# Patient Record
Sex: Female | Born: 1935
Health system: Southern US, Community
[De-identification: ages and names within clinical notes are randomized; demographics above are authoritative.]

## PROBLEM LIST (undated history)

## (undated) DIAGNOSIS — F419 Anxiety disorder, unspecified: Secondary | ICD-10-CM

## (undated) DIAGNOSIS — I10 Essential (primary) hypertension: Secondary | ICD-10-CM

---

## 1999-02-23 ENCOUNTER — Other Ambulatory Visit: Admission: RE | Admit: 1999-02-23 | Discharge: 1999-02-23 | Payer: Self-pay | Admitting: Family Medicine

## 1999-06-14 ENCOUNTER — Emergency Department (HOSPITAL_COMMUNITY): Admission: EM | Admit: 1999-06-14 | Discharge: 1999-06-14 | Payer: Self-pay | Admitting: *Deleted

## 1999-07-09 ENCOUNTER — Observation Stay (HOSPITAL_COMMUNITY): Admission: RE | Admit: 1999-07-09 | Discharge: 1999-07-10 | Payer: Self-pay

## 1999-07-09 ENCOUNTER — Encounter (INDEPENDENT_AMBULATORY_CARE_PROVIDER_SITE_OTHER): Payer: Self-pay | Admitting: Specialist

## 1999-10-25 ENCOUNTER — Encounter: Admission: RE | Admit: 1999-10-25 | Discharge: 1999-10-25 | Payer: Self-pay | Admitting: Family Medicine

## 1999-10-25 ENCOUNTER — Encounter: Payer: Self-pay | Admitting: Family Medicine

## 2001-02-23 ENCOUNTER — Other Ambulatory Visit: Admission: RE | Admit: 2001-02-23 | Discharge: 2001-02-23 | Payer: Self-pay | Admitting: Family Medicine

## 2001-03-12 ENCOUNTER — Encounter: Admission: RE | Admit: 2001-03-12 | Discharge: 2001-03-12 | Payer: Self-pay | Admitting: *Deleted

## 2001-03-12 ENCOUNTER — Encounter: Payer: Self-pay | Admitting: *Deleted

## 2001-03-19 ENCOUNTER — Encounter: Payer: Self-pay | Admitting: Family Medicine

## 2001-03-19 ENCOUNTER — Encounter: Admission: RE | Admit: 2001-03-19 | Discharge: 2001-03-19 | Payer: Self-pay | Admitting: Family Medicine

## 2001-04-23 ENCOUNTER — Inpatient Hospital Stay (HOSPITAL_COMMUNITY): Admission: EM | Admit: 2001-04-23 | Discharge: 2001-04-25 | Payer: Self-pay

## 2002-03-27 ENCOUNTER — Encounter: Payer: Self-pay | Admitting: Family Medicine

## 2002-03-27 ENCOUNTER — Encounter: Admission: RE | Admit: 2002-03-27 | Discharge: 2002-03-27 | Payer: Self-pay | Admitting: Family Medicine

## 2003-04-21 ENCOUNTER — Encounter: Admission: RE | Admit: 2003-04-21 | Discharge: 2003-04-21 | Payer: Self-pay | Admitting: Family Medicine

## 2003-04-28 ENCOUNTER — Encounter: Admission: RE | Admit: 2003-04-28 | Discharge: 2003-04-28 | Payer: Self-pay | Admitting: *Deleted

## 2003-08-06 ENCOUNTER — Other Ambulatory Visit: Admission: RE | Admit: 2003-08-06 | Discharge: 2003-08-06 | Payer: Self-pay | Admitting: Family Medicine

## 2004-05-03 ENCOUNTER — Encounter: Admission: RE | Admit: 2004-05-03 | Discharge: 2004-05-03 | Payer: Self-pay | Admitting: *Deleted

## 2004-05-26 ENCOUNTER — Ambulatory Visit (HOSPITAL_COMMUNITY): Admission: RE | Admit: 2004-05-26 | Discharge: 2004-05-26 | Payer: Self-pay | Admitting: Family Medicine

## 2005-06-22 ENCOUNTER — Ambulatory Visit (HOSPITAL_COMMUNITY): Admission: RE | Admit: 2005-06-22 | Discharge: 2005-06-22 | Payer: Self-pay | Admitting: Family Medicine

## 2006-06-15 ENCOUNTER — Encounter: Admission: RE | Admit: 2006-06-15 | Discharge: 2006-06-15 | Payer: Self-pay | Admitting: *Deleted

## 2006-07-20 ENCOUNTER — Ambulatory Visit (HOSPITAL_COMMUNITY): Admission: RE | Admit: 2006-07-20 | Discharge: 2006-07-20 | Payer: Self-pay | Admitting: Family Medicine

## 2007-07-31 ENCOUNTER — Ambulatory Visit (HOSPITAL_COMMUNITY): Admission: RE | Admit: 2007-07-31 | Discharge: 2007-07-31 | Payer: Self-pay | Admitting: Internal Medicine

## 2007-11-15 IMAGING — CT CT ANGIO ABDOMEN
2 of 7 series · 14 of 46 positions shown, 18 images · IV contrast ([ID] OMNI 300)
Comparison: 05/03/2004

CLINICAL DATA: AAA, 2 year followup. Pre stent

CT ANGIOGRAPHY OF ABDOMEN - AAA PROTOCOL
TECHNIQUE: Multidetector CT imaging of the abdomen was performed before and
during bolus injection of intravenous contrast.  Multiplanar CT angiographic
image reconstructions were also generated to evaluate the vascular anatomy.
Contrast:  100 cc Omnipaque 300
TECHNIQUE: Multidetector CT imaging of the pelvis was performed before and
image reconstructions were also generated to evaluate the vascular structures. 
(Contrast dose noted above.)

[Series 5: angio · axial · 0.78mm/px · z∈[-388,-28]mm · 11 of 173 slices shown, 15 images]
[im 19/173  soft-tissue]
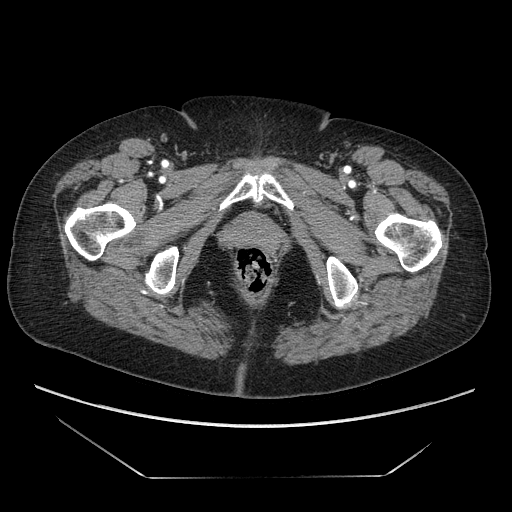
[im 19/173  bone]
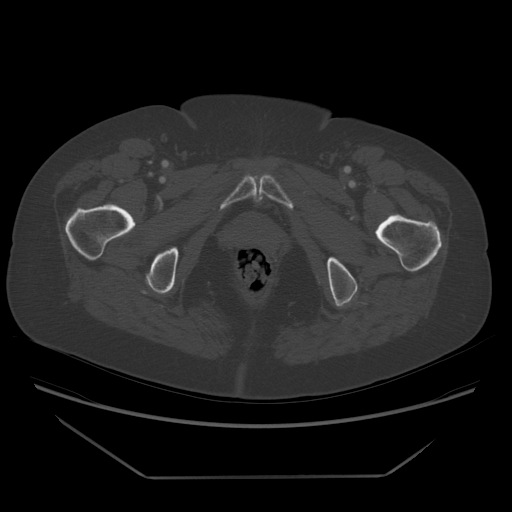
[im 37/173  soft-tissue]
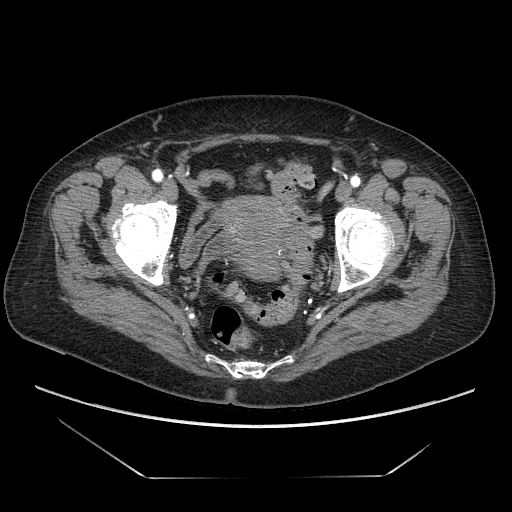
[im 55/173  soft-tissue]
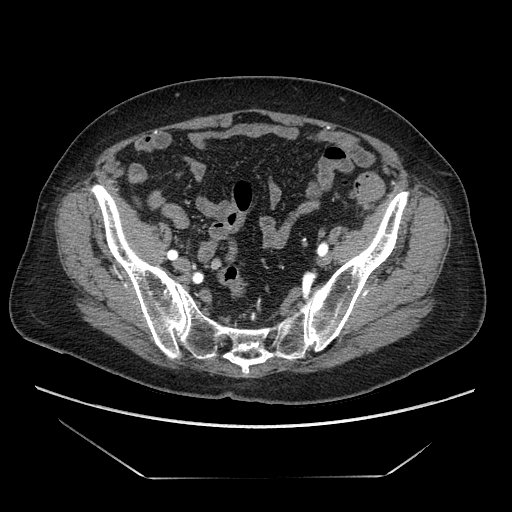
[im 73/173  soft-tissue]
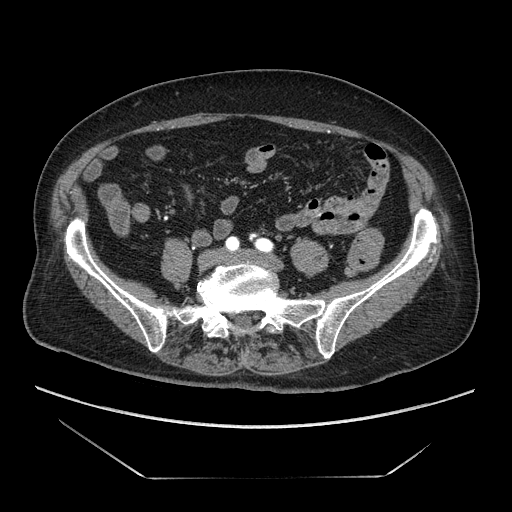
[im 91/173  soft-tissue]
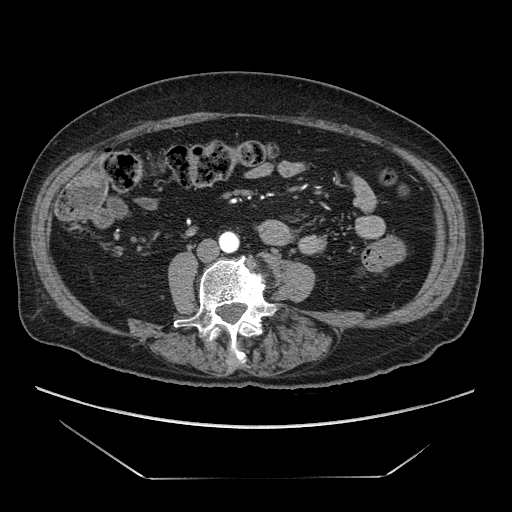
[im 109/173  soft-tissue]
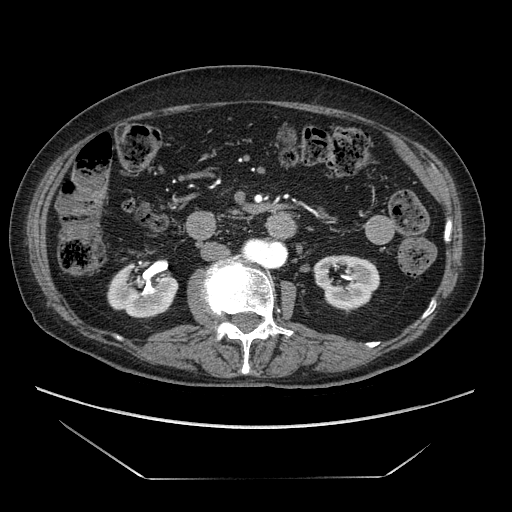
[im 127/173  soft-tissue]
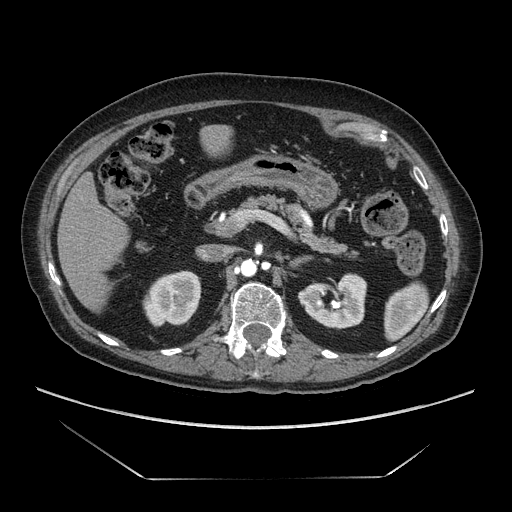
[im 136/173  lung]
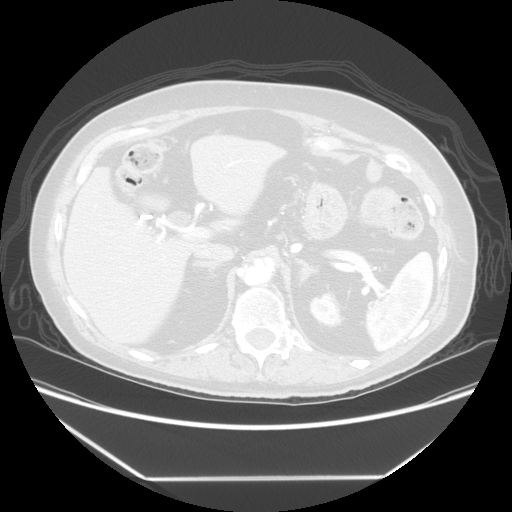
[im 145/173  soft-tissue]
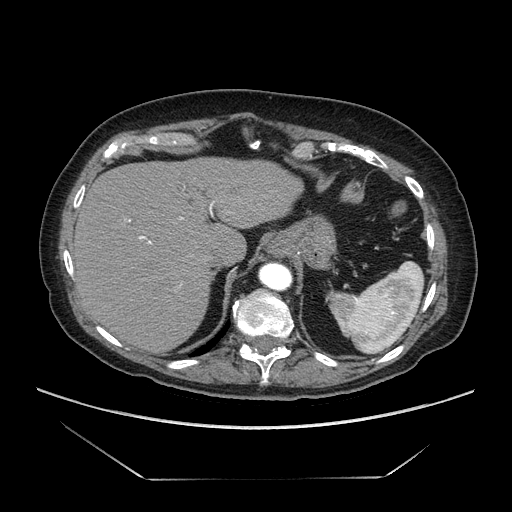
[im 145/173  lung]
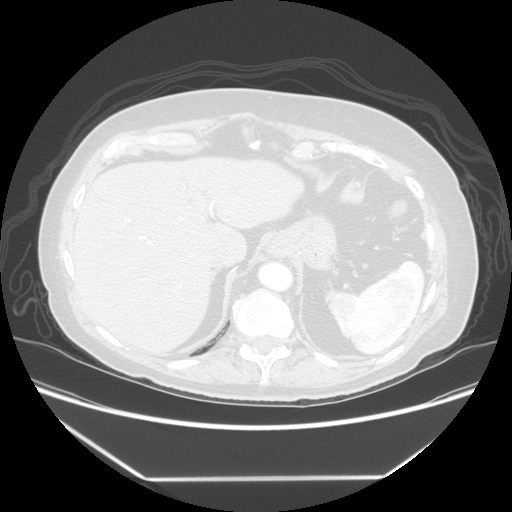
[im 154/173  lung]
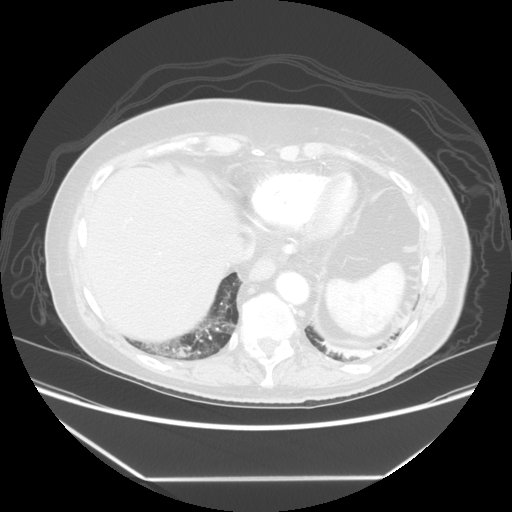
[im 163/173  soft-tissue]
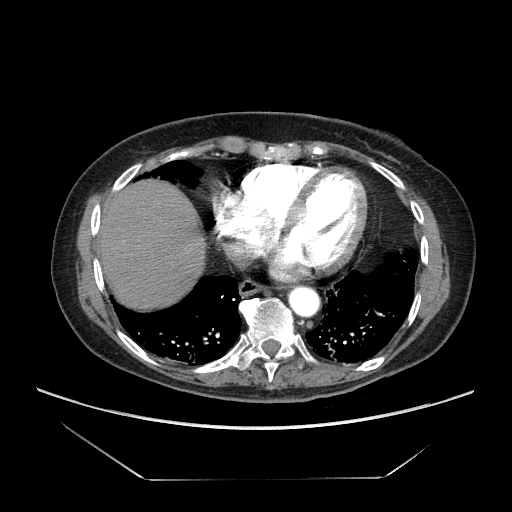
[im 163/173  lung]
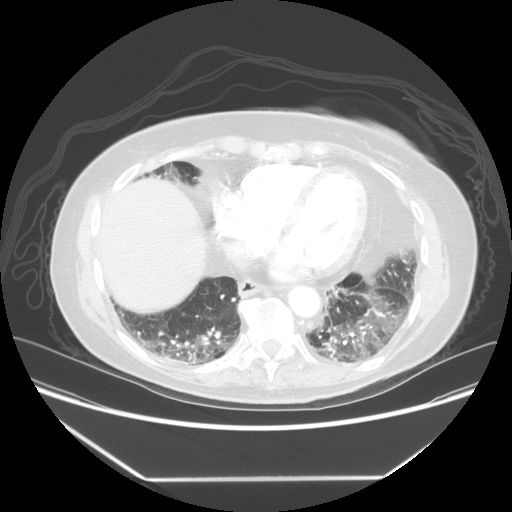
[im 163/173  bone]
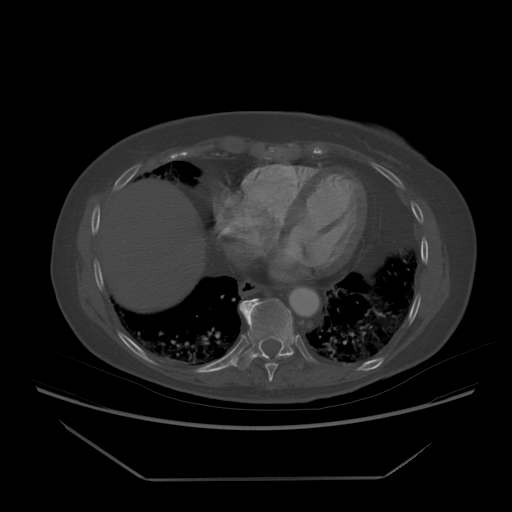

[Series 602: sagittal angio · sagittal · 0.84mm/px · 3 of 156 slices shown]
[im 39/156  soft-tissue]
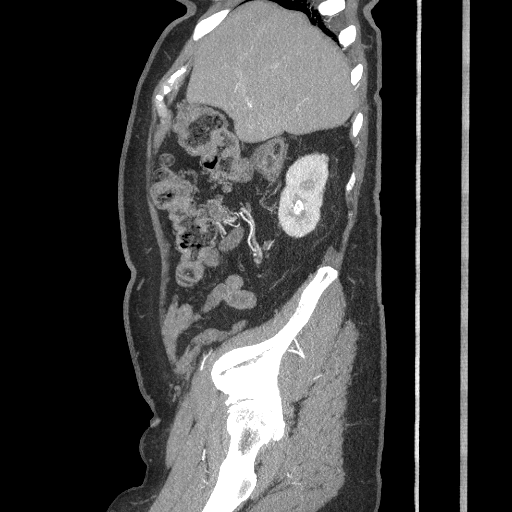
[im 78/156  soft-tissue]
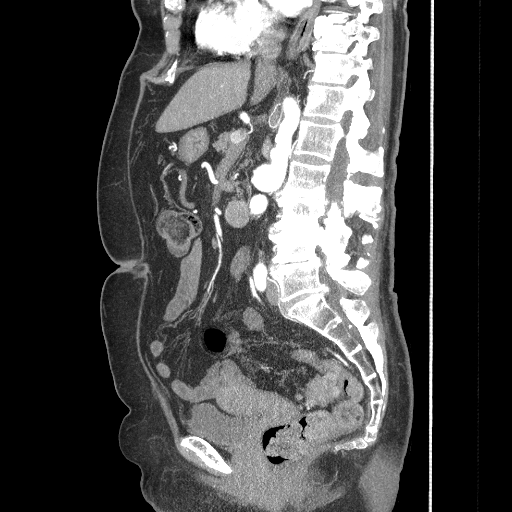
[im 117/156  soft-tissue]
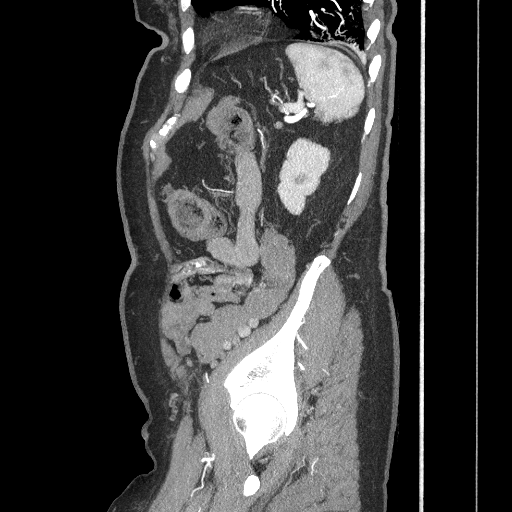

[14 of 46 positions shown; findings below may reference images not displayed]

FINDINGS: Again noted is a markedly tortuous abdominal aorta, with at least 3 
90 degree turns seen in the infrarenal abdominal aorta. Also again noted is
unusual vascular anatomy, with the celiac artery, superior mesenteric artery,
and main bilateral renal arteries arising from the abdominal aorta at the aortic
hiatus. A small accessory left renal artery arises 2 cm below the main left
renal artery.

Tiny low density lesion seen in the dome of the liver posteriorly, unchanged,
likely a simple cyst. Small hiatal hernia present. Solid organs otherwise
unremarkable. No free fluid, free air, or adenopathy. Colonic diverticula
present without active diverticulitis. Scoliosis with degenerative changes in
the lumbar spine noted.

Length of infrarenal neck (from lowest renal artery):  2.3 cm
Diameter of infrarenal neck:  1.3 cm
Number of renal arteries:  Right = 1 ; Left = 2

Total length of aneurysm:  4.5 cm
Greatest aneurysm diameter:  2.8 cm
Aneurysm ends at aortic bifurcation: No 
If no, Distance from aneurysm to bifurcation:  9 cm above 

IMPRESSION

Stable appearance of the abdominal aortic aneurysm, maximum diameter 2.8 cm.
Markedly tortuous abdominal aorta.

CT ANGIOGRAPHY OF PELVIS - AAA PROTOCOL
FINDINGS: Colonic diverticula seen without active diverticulitis. No free
fluid, free air, or adenopathy. Uterus and adnexa are unremarkable.

Greatest common iliac artery diameters:  Right = 1.2 cm ; Left = 1.0 cm
Diameter of common iliac arteries just above iliac bifurcation:  Right = 1.1 cm
; Left = 0.9 cm
Length of common iliac arteries:  Right = 4.6 cm ;  Left = 4.6 cm

IMPRESSION

Non- aneurysmal common iliac arteries as above.

Diverticulosis.

## 2008-08-21 ENCOUNTER — Ambulatory Visit (HOSPITAL_COMMUNITY): Admission: RE | Admit: 2008-08-21 | Discharge: 2008-08-21 | Payer: Self-pay | Admitting: Internal Medicine

## 2009-06-17 ENCOUNTER — Ambulatory Visit: Payer: Self-pay | Admitting: Vascular Surgery

## 2009-07-20 ENCOUNTER — Encounter: Admission: RE | Admit: 2009-07-20 | Discharge: 2009-07-20 | Payer: Self-pay | Admitting: Internal Medicine

## 2009-09-01 ENCOUNTER — Ambulatory Visit (HOSPITAL_COMMUNITY): Admission: RE | Admit: 2009-09-01 | Discharge: 2009-09-01 | Payer: Self-pay | Admitting: Internal Medicine

## 2009-09-04 ENCOUNTER — Encounter: Admission: RE | Admit: 2009-09-04 | Discharge: 2009-09-04 | Payer: Self-pay | Admitting: Internal Medicine

## 2010-07-11 ENCOUNTER — Encounter: Payer: Self-pay | Admitting: Internal Medicine

## 2010-10-14 ENCOUNTER — Other Ambulatory Visit: Payer: Self-pay | Admitting: Internal Medicine

## 2010-10-14 DIAGNOSIS — Z1231 Encounter for screening mammogram for malignant neoplasm of breast: Secondary | ICD-10-CM

## 2010-10-21 ENCOUNTER — Ambulatory Visit
Admission: RE | Admit: 2010-10-21 | Discharge: 2010-10-21 | Disposition: A | Discharge status: Home / Self Care | Payer: Medicare Other | Source: Ambulatory Visit | Attending: Internal Medicine | Admitting: Internal Medicine

## 2010-10-21 DIAGNOSIS — Z1231 Encounter for screening mammogram for malignant neoplasm of breast: Secondary | ICD-10-CM

## 2010-10-22 ENCOUNTER — Other Ambulatory Visit: Payer: Self-pay | Admitting: Internal Medicine

## 2010-10-22 DIAGNOSIS — R928 Other abnormal and inconclusive findings on diagnostic imaging of breast: Secondary | ICD-10-CM

## 2010-10-27 ENCOUNTER — Ambulatory Visit
Admission: RE | Admit: 2010-10-27 | Discharge: 2010-10-27 | Disposition: A | Discharge status: Home / Self Care | Payer: Medicare Other | Source: Ambulatory Visit | Attending: Internal Medicine | Admitting: Internal Medicine

## 2010-10-27 DIAGNOSIS — R928 Other abnormal and inconclusive findings on diagnostic imaging of breast: Secondary | ICD-10-CM

## 2010-11-02 NOTE — Procedures (Signed)
DUPLEX ULTRASOUND OF ABDOMINAL AORTA   INDICATION:  Followup abdominal aortic aneurysm.   HISTORY:  Diabetes:  No.  Cardiac:  No.  Hypertension:  Yes.  Smoking:  No.  Connective Tissue Disorder:  Family History:  No.  Previous Surgery:  No.   DUPLEX EXAM:         AP (cm)                   TRANSVERSE (cm)  Proximal             1.36 cm                   1.82 cm  Mid                  2.84 cm                   3.95 cm  Distal               1.71 cm                   1.68 cm  Right Iliac          1.40 cm                   1.40 cm  Left Iliac           1.01 cm                   1.19 cm   PREVIOUS:  Date:  2.8 cm per CT  AP:  TRANSVERSE:   IMPRESSION:  1. The patient has a tortuous aorta.  2. Abdominal aortic aneurysm with the largest measurement of 2.84 cm x      3.95 cm.   ___________________________________________  Janetta Hora. Fields, MD   CB/MEDQ  D:  06/17/2009  T:  06/17/2009  Job:  [id number]

## 2010-11-05 NOTE — H&P (Signed)
[facility]  Patient:    Dorothy Doyle, Dorothy Doyle Visit Number: [id number] MRN: [id number]          Service Type: OBV Location: 2S 6213 01 Attending Physician:  Lyn Records. Iii Dictated by:   Darci Needle, M.D. Admit Date:  04/23/2001   CC:         Colleen Can. Deborah Chalk, M.D.  Abran Cantor. Clovis Riley, M.D.   History and Physical  REASON FOR ADMISSION:  Left arm pain.  SUBJECTIVE:  The patient is 77 years of age, and awakened from sleep with left arm and posterior shoulder discomfort.  The discomfort was poorly characterized.  The discomfort is best described as an aching sensation. There is also a numb quality.  There were no other associated symptoms. Moving the arm, rubbing the arm, and assuming different positions did not improve the discomfort.  She eventually took one of her husbands nitroglycerin pills, and remarkably within five minutes of the tablet melting, the discomfort had resolved.  There were no other associated symptoms such as shortness of breath, diaphoresis, palpitations, orthopnea, dizziness, and near syncope.  ALLERGIES:  IVP DYE causes rash.  MEDICATIONS:  Lipitor of unknown dose, Lotrel of unknown dose, aspirin one per day.  HABITS:  Does not smoke or drink.  She may have a glass of wine sporadically.  PAST MEDICAL HISTORY: 1. Hypertension. 2. Abdominal aortic aneurysm followed by Dr. Bud Face. 3. Hypercholesterolemia.  FAMILY HISTORY:  Mother had a myocardial infarction at age of 46.  Father died of old age at 34.  He had had a stroke.  She has one brother and three sisters, all are living and in good health.  REVIEW OF SYSTEMS:  She has an occasional strange feeling in the neck.  There is a wave of peculiar discomfort that starts in the base of her neck and goes up towards her jaws bilaterally.  They would last for a few seconds and dissipate, and it is not associated with any other symptoms.  It is not precipitated by  physical activity.  She furthermore denies nausea, palpitations, dyspnea, neurological symptoms, melena, or joint swelling.  She occasionally has low back discomfort.  OBJECTIVE:  VITAL SIGNS:  Blood pressure 128/72, heart rate 68, respirations 16 and unlabored.  She is afebrile.  SKIN:  Clear, no nail bed cyanosis.  HEENT EXAMINATION:  Reveals the pupils are equal and reactive to light.  No xanthelasma.  NECK EXAMINATION:  No JVD, thyromegaly, or carotid bruits.  LUNGS:  Clear to auscultation and percussion.  CARDIAC EXAMINATION:  Reveals no click, no rubs, and no gallops.  ABDOMEN:  Soft, the liver and spleen are not palpable.  Bowel sounds are normal.  No pulsatile masses are noted.  No bruits are heard.  EXTREMITIES:  Revealed no edema.  Pulses are 2+ in the posterior tibial bilaterally.  Radial pulses are 2+.  There is no peripheral edema.  NEUROLOGIC EXAMINATION:  Reveals the patient is alert and oriented x 3.  No motor or sensory deficits are noted.  LABORATORY DATA:  EKG is normal.  Laboratory data includes a normal CPK and troponin initially drawn.  The hemoglobin is 13.  I do not have the results of the patients BMET.  I have not seen her chest x-ray.  ASSESSMENT: 1. Left arm discomfort, uncertain etiology.  Possibly responsive to    nitroglycerin. 2. History of abdominal aortic aneurysm. 3. Hypercholesterolemia. 4. Hypertension.  PLAN: 1. Continue usual antihypertensive therapy. 2. Serial  enzymes, rule out infarction. 3. Further workup per Dr. Roger Shelter. Dictated by:   Darci Needle, M.D. Attending Physician:  Lyn Records. Iii DD:  04/23/01 TD:  04/23/01 Job: [id number]

## 2010-11-05 NOTE — Discharge Summary (Signed)
[facility]  Patient:    Dorothy Doyle, Dorothy Doyle Visit Number: [id number] MRN: [id number]          Service Type: OBV Location: 3W 4782 01 Attending Physician:  Eleanora Neighbor Dictated by:   Jennet Maduro Earl Gala, R.N., A.N.P. Admit Date:  04/23/2001 Discharge Date: 04/25/2001   CC:         Melvyn Neth D. Clovis Riley, M.D.                           Discharge Summary  PRIMARY DISCHARGE DIAGNOSES: Left arm pain responsive to nitroglycerin with subsequent negative cardiac enzymes with elective coronary angiography showing left ventricular function normal, left main is normal, left anterior descending has minor irregularities and left circumflex as well as the right coronary artery is normal. There is tortuosity of the aorta. She will continue to be managed medically.  SECONDARY DISCHARGE DIAGNOSES: 1. Hypertension. 2. Hypercholesterolemia. 3. Known abdominal aortic aneurysm followed by Dr. Liliane Bade.  HISTORY OF PRESENT ILLNESS: The patient is a pleasant 77 year old white female, whose husband is a patient of Dr. Einar Gip. She presented to the emergency room with left arm discomfort that was clearly resolved with nitroglycerins. She was subsequently seen and evaluated and admitted for further treatment.  Please see dictated history and physical per Dr. Garnette Scheuermann for further patient presentation and profile.  ADMISSION LABORATORY DATA: ECG was normal. Cardiac enzymes were normal. Chest x-ray showed a left basilar opacity, likely representing atelectasis and a small left pleural effusion could not be excluded. This was a portable film.  Chemistries on admission were normal. PT and PTT were unremarkable. CBC was normal as well.  Lipid profile showed triglycerides 103, HDL 43, LDL 74 with a total cholesterol of 138.  HOSPITAL COURSE: The patient was admitted. She ruled out negative for myocardial infarction. In light of her response to nitroglycerin as  well as her multiple cardiovascular risk factors we proceeded on with coronary angiography the following day. The procedure was tolerated well without any problems.  Coronary angiography results are as noted above. Her left arm pain was not felt to be cardiac related. She did have postprocedural nausea and vomiting and has had a previous questionable IVP DYE allergy. She was treated with IV fluids.  Her followup labs the following morning were normal and she is felt to be a stable candidate today for disease. Her overall physical examination is unremarkable. The groins remain stable and she is a stable candidate for discharge.  DISCHARGE CONDITION: Stable.  She will resume her home medicines which include Lipitor, Lotrel and aspirin.  ACTIVITY: To be light for the next 1-2 days. She is to use an ice pack as needed to the groin and we will ask her to follow up with Dr. Clovis Riley in the next couple of weeks. She will see Dr. Deborah Chalk back on an as needed basis. Dictated by:   Jennet Maduro Earl Gala, R.N., A.N.P. Attending Physician:  Eleanora Neighbor DD:  04/25/01 TD:  04/26/01 Job: [id number]

## 2010-11-05 NOTE — Cardiovascular Report (Signed)
[facility]  Patient:    Dorothy Doyle, Dorothy Doyle Visit Number: [id number] MRN: [id number]          Service Type: OBV Location: 3W 5784 01 Attending Physician:  Eleanora Neighbor Dictated by:   Colleen Can. Deborah Chalk, M.D. Proc. Date: 04/24/01 Admit Date:  04/23/2001   CC:         Melvyn Neth D. Clovis Riley, M.D.   Cardiac Catheterization  HISTORY: The patient presents with left shoulder pain and is referred for cardiac catheterization. She has a history of abdominal aortic aneurysm.  PROCEDURE: Left heart catheterization with selective coronary angiography, left ventricular angiography.  TYPE AND SITE OF ENTRY: Percutaneous right femoral artery.  CATHETERS: The 6 French 4 curved Judkins right and left coronary catheters, 6 French pigtail ventriculographic catheter.  CONTRAST MATERIAL: Omnipaque.  MEDICATIONS GIVEN PRIOR TO THE PROCEDURE: Valium 10 mg p.o, Versed 25 mg IV.  COMMENTS: The patient tolerated the procedure well.  HEMODYNAMIC DATA: The aortic pressure was 99/53, LV was 103/14.  There was no aortic valve gradient noted on pullback.  ANGIOGRAPHIC DATA: 1. Right coronary artery is a dominant vessel. It has some tortuosity    distally but essentially normal. 2. Left main coronary artery is normal. 3. Left anterior descending has mild irregularities with a 20% segmental    narrowing, otherwise it has minimal nonobstructive coronary disease. 4. The left circumflex is a reasonably large vessel with four obtuse marginal    branches. It is normal.  LEFT VENTRICULAR ANGIOGRAM: Left ventricular angiogram was performed in the RAO position.  Overall cardiac size and silhouette were normal.  Left ventricular function is normal.  Pictures of the abdominal aorta were taken in a panning view after the left ventriculogram and also in the LAO projection using 30 cc of contrast at 20 cc/sec. The aorta is elongated and tortuous. In the RAO view, it  appears that it actually tends to be more of a redundance than true increase in diameter. The distal iliac vessels are relatively free of significant atherosclerosis.  OVERALL IMPRESSION: 1. Normal left ventricular function. 2. Minimal coronary atherosclerosis with mild nonobstructive disease in the    left anterior descending system. 3. Tortuous abdominal aorta with mild increase in the luminal diameter.  DISCUSSION: It is felt that the patients chest pain is probably not cardiac in nature. Dictated by:   Colleen Can Deborah Chalk, M.D. Attending Physician:  Eleanora Neighbor DD:  04/24/01 TD:  04/25/01 Job: [id number]

## 2011-07-22 DIAGNOSIS — H02839 Dermatochalasis of unspecified eye, unspecified eyelid: Secondary | ICD-10-CM | POA: Diagnosis not present

## 2011-07-22 DIAGNOSIS — H538 Other visual disturbances: Secondary | ICD-10-CM | POA: Diagnosis not present

## 2011-07-22 DIAGNOSIS — H2510 Age-related nuclear cataract, unspecified eye: Secondary | ICD-10-CM | POA: Diagnosis not present

## 2011-07-22 DIAGNOSIS — H35369 Drusen (degenerative) of macula, unspecified eye: Secondary | ICD-10-CM | POA: Diagnosis not present

## 2011-08-16 DIAGNOSIS — E785 Hyperlipidemia, unspecified: Secondary | ICD-10-CM | POA: Diagnosis not present

## 2011-08-16 DIAGNOSIS — I714 Abdominal aortic aneurysm, without rupture: Secondary | ICD-10-CM | POA: Diagnosis not present

## 2011-08-16 DIAGNOSIS — I739 Peripheral vascular disease, unspecified: Secondary | ICD-10-CM | POA: Diagnosis not present

## 2011-08-16 DIAGNOSIS — E559 Vitamin D deficiency, unspecified: Secondary | ICD-10-CM | POA: Diagnosis not present

## 2011-08-30 DIAGNOSIS — I714 Abdominal aortic aneurysm, without rupture: Secondary | ICD-10-CM | POA: Diagnosis not present

## 2011-08-30 DIAGNOSIS — I739 Peripheral vascular disease, unspecified: Secondary | ICD-10-CM | POA: Diagnosis not present

## 2011-10-03 DIAGNOSIS — L570 Actinic keratosis: Secondary | ICD-10-CM | POA: Diagnosis not present

## 2011-10-03 DIAGNOSIS — L819 Disorder of pigmentation, unspecified: Secondary | ICD-10-CM | POA: Diagnosis not present

## 2011-10-25 ENCOUNTER — Other Ambulatory Visit: Payer: Self-pay | Admitting: Internal Medicine

## 2011-10-25 DIAGNOSIS — Z1231 Encounter for screening mammogram for malignant neoplasm of breast: Secondary | ICD-10-CM

## 2011-11-10 ENCOUNTER — Ambulatory Visit
Admission: RE | Admit: 2011-11-10 | Discharge: 2011-11-10 | Disposition: A | Discharge status: Home / Self Care | Payer: Medicare Other | Source: Ambulatory Visit | Attending: Internal Medicine | Admitting: Internal Medicine

## 2011-11-10 ENCOUNTER — Other Ambulatory Visit: Payer: Self-pay | Admitting: Internal Medicine

## 2011-11-10 DIAGNOSIS — Z1231 Encounter for screening mammogram for malignant neoplasm of breast: Secondary | ICD-10-CM

## 2011-11-23 ENCOUNTER — Ambulatory Visit
Admission: RE | Admit: 2011-11-23 | Discharge: 2011-11-23 | Disposition: A | Discharge status: Home / Self Care | Payer: Medicare Other | Source: Ambulatory Visit | Attending: Internal Medicine | Admitting: Internal Medicine

## 2011-11-23 DIAGNOSIS — Z1231 Encounter for screening mammogram for malignant neoplasm of breast: Secondary | ICD-10-CM | POA: Diagnosis not present

## 2012-02-14 DIAGNOSIS — E559 Vitamin D deficiency, unspecified: Secondary | ICD-10-CM | POA: Diagnosis not present

## 2012-02-14 DIAGNOSIS — I10 Essential (primary) hypertension: Secondary | ICD-10-CM | POA: Diagnosis not present

## 2012-02-14 DIAGNOSIS — E785 Hyperlipidemia, unspecified: Secondary | ICD-10-CM | POA: Diagnosis not present

## 2012-02-14 DIAGNOSIS — F411 Generalized anxiety disorder: Secondary | ICD-10-CM | POA: Diagnosis not present

## 2012-02-14 DIAGNOSIS — N63 Unspecified lump in unspecified breast: Secondary | ICD-10-CM | POA: Diagnosis not present

## 2012-02-15 ENCOUNTER — Other Ambulatory Visit: Payer: Self-pay | Admitting: Internal Medicine

## 2012-02-15 DIAGNOSIS — N6320 Unspecified lump in the left breast, unspecified quadrant: Secondary | ICD-10-CM

## 2012-02-17 ENCOUNTER — Ambulatory Visit
Admission: RE | Admit: 2012-02-17 | Discharge: 2012-02-17 | Disposition: A | Discharge status: Home / Self Care | Payer: Medicare Other | Source: Ambulatory Visit | Attending: Internal Medicine | Admitting: Internal Medicine

## 2012-02-17 DIAGNOSIS — N6320 Unspecified lump in the left breast, unspecified quadrant: Secondary | ICD-10-CM

## 2012-02-17 DIAGNOSIS — N6009 Solitary cyst of unspecified breast: Secondary | ICD-10-CM | POA: Diagnosis not present

## 2012-04-02 DIAGNOSIS — Z23 Encounter for immunization: Secondary | ICD-10-CM | POA: Diagnosis not present

## 2012-04-02 DIAGNOSIS — L570 Actinic keratosis: Secondary | ICD-10-CM | POA: Diagnosis not present

## 2012-08-16 DIAGNOSIS — M79609 Pain in unspecified limb: Secondary | ICD-10-CM | POA: Diagnosis not present

## 2012-08-29 ENCOUNTER — Encounter (INDEPENDENT_AMBULATORY_CARE_PROVIDER_SITE_OTHER): Payer: Medicare Other | Admitting: *Deleted

## 2012-08-29 DIAGNOSIS — I714 Abdominal aortic aneurysm, without rupture: Secondary | ICD-10-CM | POA: Diagnosis not present

## 2012-08-29 DIAGNOSIS — D509 Iron deficiency anemia, unspecified: Secondary | ICD-10-CM | POA: Diagnosis not present

## 2012-10-02 DIAGNOSIS — D509 Iron deficiency anemia, unspecified: Secondary | ICD-10-CM | POA: Diagnosis not present

## 2012-11-07 DIAGNOSIS — D509 Iron deficiency anemia, unspecified: Secondary | ICD-10-CM | POA: Diagnosis not present

## 2012-11-13 ENCOUNTER — Other Ambulatory Visit: Payer: Self-pay

## 2012-11-13 DIAGNOSIS — Z1231 Encounter for screening mammogram for malignant neoplasm of breast: Secondary | ICD-10-CM

## 2012-11-23 ENCOUNTER — Ambulatory Visit
Admission: RE | Admit: 2012-11-23 | Discharge: 2012-11-23 | Disposition: A | Discharge status: Home / Self Care | Payer: Medicare Other | Source: Ambulatory Visit

## 2012-11-23 DIAGNOSIS — Z1231 Encounter for screening mammogram for malignant neoplasm of breast: Secondary | ICD-10-CM

## 2012-12-12 DIAGNOSIS — D509 Iron deficiency anemia, unspecified: Secondary | ICD-10-CM | POA: Diagnosis not present

## 2013-01-16 DIAGNOSIS — D509 Iron deficiency anemia, unspecified: Secondary | ICD-10-CM | POA: Diagnosis not present

## 2013-02-11 DIAGNOSIS — I714 Abdominal aortic aneurysm, without rupture: Secondary | ICD-10-CM | POA: Diagnosis not present

## 2013-02-11 DIAGNOSIS — M79609 Pain in unspecified limb: Secondary | ICD-10-CM | POA: Diagnosis not present

## 2013-02-11 DIAGNOSIS — M899 Disorder of bone, unspecified: Secondary | ICD-10-CM | POA: Diagnosis not present

## 2013-02-11 DIAGNOSIS — I10 Essential (primary) hypertension: Secondary | ICD-10-CM | POA: Diagnosis not present

## 2013-02-11 DIAGNOSIS — E538 Deficiency of other specified B group vitamins: Secondary | ICD-10-CM | POA: Diagnosis not present

## 2013-02-11 DIAGNOSIS — E785 Hyperlipidemia, unspecified: Secondary | ICD-10-CM | POA: Diagnosis not present

## 2013-02-11 DIAGNOSIS — F411 Generalized anxiety disorder: Secondary | ICD-10-CM | POA: Diagnosis not present

## 2013-02-11 DIAGNOSIS — D509 Iron deficiency anemia, unspecified: Secondary | ICD-10-CM | POA: Diagnosis not present

## 2013-02-11 DIAGNOSIS — E559 Vitamin D deficiency, unspecified: Secondary | ICD-10-CM | POA: Diagnosis not present

## 2013-02-20 DIAGNOSIS — E538 Deficiency of other specified B group vitamins: Secondary | ICD-10-CM | POA: Diagnosis not present

## 2013-02-20 DIAGNOSIS — Z23 Encounter for immunization: Secondary | ICD-10-CM | POA: Diagnosis not present

## 2013-06-28 DIAGNOSIS — H2510 Age-related nuclear cataract, unspecified eye: Secondary | ICD-10-CM | POA: Diagnosis not present

## 2013-06-28 DIAGNOSIS — H25049 Posterior subcapsular polar age-related cataract, unspecified eye: Secondary | ICD-10-CM | POA: Diagnosis not present

## 2013-06-28 DIAGNOSIS — H02839 Dermatochalasis of unspecified eye, unspecified eyelid: Secondary | ICD-10-CM | POA: Diagnosis not present

## 2013-06-28 DIAGNOSIS — H04129 Dry eye syndrome of unspecified lacrimal gland: Secondary | ICD-10-CM | POA: Diagnosis not present

## 2013-07-25 DIAGNOSIS — H2010 Chronic iridocyclitis, unspecified eye: Secondary | ICD-10-CM | POA: Diagnosis not present

## 2013-07-25 DIAGNOSIS — H2510 Age-related nuclear cataract, unspecified eye: Secondary | ICD-10-CM | POA: Diagnosis not present

## 2013-07-25 DIAGNOSIS — H35369 Drusen (degenerative) of macula, unspecified eye: Secondary | ICD-10-CM | POA: Diagnosis not present

## 2013-07-25 DIAGNOSIS — H353190 Nonexudative age-related macular degeneration, unspecified eye, stage unspecified: Secondary | ICD-10-CM | POA: Diagnosis not present

## 2013-07-25 DIAGNOSIS — H25049 Posterior subcapsular polar age-related cataract, unspecified eye: Secondary | ICD-10-CM | POA: Diagnosis not present

## 2013-08-14 DIAGNOSIS — M949 Disorder of cartilage, unspecified: Secondary | ICD-10-CM | POA: Diagnosis not present

## 2013-08-14 DIAGNOSIS — I714 Abdominal aortic aneurysm, without rupture, unspecified: Secondary | ICD-10-CM | POA: Diagnosis not present

## 2013-08-14 DIAGNOSIS — Z1331 Encounter for screening for depression: Secondary | ICD-10-CM | POA: Diagnosis not present

## 2013-08-14 DIAGNOSIS — E538 Deficiency of other specified B group vitamins: Secondary | ICD-10-CM | POA: Diagnosis not present

## 2013-08-14 DIAGNOSIS — I10 Essential (primary) hypertension: Secondary | ICD-10-CM | POA: Diagnosis not present

## 2013-08-14 DIAGNOSIS — E785 Hyperlipidemia, unspecified: Secondary | ICD-10-CM | POA: Diagnosis not present

## 2013-08-14 DIAGNOSIS — E559 Vitamin D deficiency, unspecified: Secondary | ICD-10-CM | POA: Diagnosis not present

## 2013-08-14 DIAGNOSIS — D509 Iron deficiency anemia, unspecified: Secondary | ICD-10-CM | POA: Diagnosis not present

## 2013-08-14 DIAGNOSIS — M899 Disorder of bone, unspecified: Secondary | ICD-10-CM | POA: Diagnosis not present

## 2013-08-14 DIAGNOSIS — F411 Generalized anxiety disorder: Secondary | ICD-10-CM | POA: Diagnosis not present

## 2013-08-26 ENCOUNTER — Ambulatory Visit (HOSPITAL_COMMUNITY)
Admission: RE | Admit: 2013-08-26 | Discharge: 2013-08-26 | Disposition: A | Discharge status: Home / Self Care | Payer: Medicare Other | Source: Ambulatory Visit | Attending: Vascular Surgery | Admitting: Vascular Surgery

## 2013-08-26 ENCOUNTER — Other Ambulatory Visit (HOSPITAL_COMMUNITY): Payer: Self-pay | Admitting: Internal Medicine

## 2013-08-26 DIAGNOSIS — I714 Abdominal aortic aneurysm, without rupture, unspecified: Secondary | ICD-10-CM

## 2013-09-10 DIAGNOSIS — H269 Unspecified cataract: Secondary | ICD-10-CM | POA: Diagnosis not present

## 2013-09-10 DIAGNOSIS — H2510 Age-related nuclear cataract, unspecified eye: Secondary | ICD-10-CM | POA: Diagnosis not present

## 2013-10-15 DIAGNOSIS — H25019 Cortical age-related cataract, unspecified eye: Secondary | ICD-10-CM | POA: Diagnosis not present

## 2013-10-15 DIAGNOSIS — H2510 Age-related nuclear cataract, unspecified eye: Secondary | ICD-10-CM | POA: Diagnosis not present

## 2013-11-26 DIAGNOSIS — H2510 Age-related nuclear cataract, unspecified eye: Secondary | ICD-10-CM | POA: Diagnosis not present

## 2013-11-26 DIAGNOSIS — H269 Unspecified cataract: Secondary | ICD-10-CM | POA: Diagnosis not present

## 2013-12-02 DIAGNOSIS — R42 Dizziness and giddiness: Secondary | ICD-10-CM | POA: Diagnosis not present

## 2013-12-02 DIAGNOSIS — I10 Essential (primary) hypertension: Secondary | ICD-10-CM | POA: Diagnosis not present

## 2014-02-19 DIAGNOSIS — D509 Iron deficiency anemia, unspecified: Secondary | ICD-10-CM | POA: Diagnosis not present

## 2014-02-19 DIAGNOSIS — M899 Disorder of bone, unspecified: Secondary | ICD-10-CM | POA: Diagnosis not present

## 2014-02-19 DIAGNOSIS — R42 Dizziness and giddiness: Secondary | ICD-10-CM | POA: Diagnosis not present

## 2014-02-19 DIAGNOSIS — I10 Essential (primary) hypertension: Secondary | ICD-10-CM | POA: Diagnosis not present

## 2014-02-19 DIAGNOSIS — I714 Abdominal aortic aneurysm, without rupture, unspecified: Secondary | ICD-10-CM | POA: Diagnosis not present

## 2014-02-19 DIAGNOSIS — Z6825 Body mass index (BMI) 25.0-25.9, adult: Secondary | ICD-10-CM | POA: Diagnosis not present

## 2014-02-19 DIAGNOSIS — E785 Hyperlipidemia, unspecified: Secondary | ICD-10-CM | POA: Diagnosis not present

## 2014-02-19 DIAGNOSIS — M949 Disorder of cartilage, unspecified: Secondary | ICD-10-CM | POA: Diagnosis not present

## 2014-02-19 DIAGNOSIS — E538 Deficiency of other specified B group vitamins: Secondary | ICD-10-CM | POA: Diagnosis not present

## 2014-02-28 ENCOUNTER — Ambulatory Visit (HOSPITAL_COMMUNITY)
Admission: RE | Admit: 2014-02-28 | Discharge: 2014-02-28 | Disposition: A | Discharge status: Home / Self Care | Payer: Medicare Other | Source: Ambulatory Visit | Attending: Vascular Surgery | Admitting: Vascular Surgery

## 2014-02-28 ENCOUNTER — Other Ambulatory Visit (HOSPITAL_COMMUNITY): Payer: Self-pay | Admitting: Endocrinology

## 2014-02-28 DIAGNOSIS — I714 Abdominal aortic aneurysm, without rupture, unspecified: Secondary | ICD-10-CM | POA: Insufficient documentation

## 2014-04-19 DIAGNOSIS — Z23 Encounter for immunization: Secondary | ICD-10-CM | POA: Diagnosis not present

## 2014-08-21 DIAGNOSIS — Z6823 Body mass index (BMI) 23.0-23.9, adult: Secondary | ICD-10-CM | POA: Diagnosis not present

## 2014-08-21 DIAGNOSIS — I10 Essential (primary) hypertension: Secondary | ICD-10-CM | POA: Diagnosis not present

## 2014-08-21 DIAGNOSIS — M859 Disorder of bone density and structure, unspecified: Secondary | ICD-10-CM | POA: Diagnosis not present

## 2014-08-21 DIAGNOSIS — D509 Iron deficiency anemia, unspecified: Secondary | ICD-10-CM | POA: Diagnosis not present

## 2014-08-21 DIAGNOSIS — I872 Venous insufficiency (chronic) (peripheral): Secondary | ICD-10-CM | POA: Diagnosis not present

## 2014-08-21 DIAGNOSIS — E785 Hyperlipidemia, unspecified: Secondary | ICD-10-CM | POA: Diagnosis not present

## 2014-08-21 DIAGNOSIS — I480 Paroxysmal atrial fibrillation: Secondary | ICD-10-CM | POA: Diagnosis not present

## 2014-08-21 DIAGNOSIS — R634 Abnormal weight loss: Secondary | ICD-10-CM | POA: Diagnosis not present

## 2014-08-21 DIAGNOSIS — E538 Deficiency of other specified B group vitamins: Secondary | ICD-10-CM | POA: Diagnosis not present

## 2014-08-21 DIAGNOSIS — F419 Anxiety disorder, unspecified: Secondary | ICD-10-CM | POA: Diagnosis not present

## 2014-09-15 DIAGNOSIS — D3131 Benign neoplasm of right choroid: Secondary | ICD-10-CM | POA: Diagnosis not present

## 2014-09-15 DIAGNOSIS — Z961 Presence of intraocular lens: Secondary | ICD-10-CM | POA: Diagnosis not present

## 2014-09-15 DIAGNOSIS — H35033 Hypertensive retinopathy, bilateral: Secondary | ICD-10-CM | POA: Diagnosis not present

## 2014-09-15 DIAGNOSIS — H3531 Nonexudative age-related macular degeneration: Secondary | ICD-10-CM | POA: Diagnosis not present

## 2014-11-12 DIAGNOSIS — I739 Peripheral vascular disease, unspecified: Secondary | ICD-10-CM | POA: Diagnosis not present

## 2014-11-12 DIAGNOSIS — I872 Venous insufficiency (chronic) (peripheral): Secondary | ICD-10-CM | POA: Diagnosis not present

## 2014-11-12 DIAGNOSIS — M79606 Pain in leg, unspecified: Secondary | ICD-10-CM | POA: Diagnosis not present

## 2014-11-12 DIAGNOSIS — Z6823 Body mass index (BMI) 23.0-23.9, adult: Secondary | ICD-10-CM | POA: Diagnosis not present

## 2014-11-13 DIAGNOSIS — I739 Peripheral vascular disease, unspecified: Secondary | ICD-10-CM | POA: Diagnosis not present

## 2014-11-13 DIAGNOSIS — M79606 Pain in leg, unspecified: Secondary | ICD-10-CM | POA: Diagnosis not present

## 2015-01-29 ENCOUNTER — Encounter (HOSPITAL_COMMUNITY): Payer: Self-pay | Admitting: Emergency Medicine

## 2015-01-29 ENCOUNTER — Emergency Department (HOSPITAL_COMMUNITY): Payer: Medicare Other

## 2015-01-29 ENCOUNTER — Emergency Department (HOSPITAL_COMMUNITY)
Admission: EM | Admit: 2015-01-29 | Discharge: 2015-01-29 | Disposition: A | Discharge status: Home / Self Care | Payer: Medicare Other | Attending: Emergency Medicine | Admitting: Emergency Medicine

## 2015-01-29 DIAGNOSIS — Z79899 Other long term (current) drug therapy: Secondary | ICD-10-CM | POA: Insufficient documentation

## 2015-01-29 DIAGNOSIS — Y998 Other external cause status: Secondary | ICD-10-CM | POA: Diagnosis not present

## 2015-01-29 DIAGNOSIS — Y92481 Parking lot as the place of occurrence of the external cause: Secondary | ICD-10-CM | POA: Diagnosis not present

## 2015-01-29 DIAGNOSIS — N3000 Acute cystitis without hematuria: Secondary | ICD-10-CM | POA: Insufficient documentation

## 2015-01-29 DIAGNOSIS — W1839XA Other fall on same level, initial encounter: Secondary | ICD-10-CM | POA: Insufficient documentation

## 2015-01-29 DIAGNOSIS — Z7982 Long term (current) use of aspirin: Secondary | ICD-10-CM | POA: Diagnosis not present

## 2015-01-29 DIAGNOSIS — S80212A Abrasion, left knee, initial encounter: Secondary | ICD-10-CM | POA: Insufficient documentation

## 2015-01-29 DIAGNOSIS — I10 Essential (primary) hypertension: Secondary | ICD-10-CM | POA: Insufficient documentation

## 2015-01-29 DIAGNOSIS — R42 Dizziness and giddiness: Secondary | ICD-10-CM | POA: Diagnosis not present

## 2015-01-29 DIAGNOSIS — W19XXXA Unspecified fall, initial encounter: Secondary | ICD-10-CM

## 2015-01-29 DIAGNOSIS — S0990XA Unspecified injury of head, initial encounter: Secondary | ICD-10-CM | POA: Insufficient documentation

## 2015-01-29 DIAGNOSIS — R404 Transient alteration of awareness: Secondary | ICD-10-CM | POA: Diagnosis not present

## 2015-01-29 DIAGNOSIS — R55 Syncope and collapse: Secondary | ICD-10-CM | POA: Diagnosis not present

## 2015-01-29 DIAGNOSIS — S50312A Abrasion of left elbow, initial encounter: Secondary | ICD-10-CM | POA: Insufficient documentation

## 2015-01-29 DIAGNOSIS — Y9301 Activity, walking, marching and hiking: Secondary | ICD-10-CM | POA: Insufficient documentation

## 2015-01-29 DIAGNOSIS — F419 Anxiety disorder, unspecified: Secondary | ICD-10-CM | POA: Insufficient documentation

## 2015-01-29 DIAGNOSIS — S0081XA Abrasion of other part of head, initial encounter: Secondary | ICD-10-CM | POA: Insufficient documentation

## 2015-01-29 DIAGNOSIS — S80211A Abrasion, right knee, initial encounter: Secondary | ICD-10-CM | POA: Insufficient documentation

## 2015-01-29 DIAGNOSIS — R531 Weakness: Secondary | ICD-10-CM | POA: Diagnosis not present

## 2015-01-29 HISTORY — DX: Essential (primary) hypertension: I10

## 2015-01-29 HISTORY — DX: Anxiety disorder, unspecified: F41.9

## 2015-01-29 LAB — URINALYSIS, ROUTINE W REFLEX MICROSCOPIC
Bilirubin Urine: NEGATIVE
Glucose, UA: NEGATIVE mg/dL
Ketones, ur: NEGATIVE mg/dL
Nitrite: POSITIVE — AB
Protein, ur: NEGATIVE mg/dL
Specific Gravity, Urine: 1.014 (ref 1.005–1.030)
Urobilinogen, UA: 0.2 mg/dL (ref 0.0–1.0)
pH: 8 (ref 5.0–8.0)

## 2015-01-29 LAB — CBC WITH DIFFERENTIAL/PLATELET
Basophils Absolute: 0 K/uL (ref 0.0–0.1)
Basophils Relative: 0 % (ref 0–1)
Eosinophils Absolute: 0.1 K/uL (ref 0.0–0.7)
Eosinophils Relative: 1 % (ref 0–5)
HCT: 42 % (ref 36.0–46.0)
Hemoglobin: 14 g/dL (ref 12.0–15.0)
Lymphocytes Relative: 25 % (ref 12–46)
Lymphs Abs: 1.9 K/uL (ref 0.7–4.0)
MCH: 30.9 pg (ref 26.0–34.0)
MCHC: 33.3 g/dL (ref 30.0–36.0)
MCV: 92.7 fL (ref 78.0–100.0)
Monocytes Absolute: 0.4 K/uL (ref 0.1–1.0)
Monocytes Relative: 5 % (ref 3–12)
Neutro Abs: 5.4 K/uL (ref 1.7–7.7)
Neutrophils Relative %: 69 % (ref 43–77)
Platelets: 225 K/uL (ref 150–400)
RBC: 4.53 MIL/uL (ref 3.87–5.11)
RDW: 12.8 % (ref 11.5–15.5)
WBC: 7.9 K/uL (ref 4.0–10.5)

## 2015-01-29 LAB — I-STAT CHEM 8, ED
BUN: 27 mg/dL — ABNORMAL HIGH (ref 6–20)
CHLORIDE: 104 mmol/L (ref 101–111)
CREATININE: 0.8 mg/dL (ref 0.44–1.00)
Calcium, Ion: 1.22 mmol/L (ref 1.13–1.30)
GLUCOSE: 122 mg/dL — AB (ref 65–99)
HEMATOCRIT: 41 % (ref 36.0–46.0)
Hemoglobin: 13.9 g/dL (ref 12.0–15.0)
POTASSIUM: 3.9 mmol/L (ref 3.5–5.1)
SODIUM: 139 mmol/L (ref 135–145)
TCO2: 24 mmol/L (ref 0–100)

## 2015-01-29 LAB — URINE MICROSCOPIC-ADD ON

## 2015-01-29 LAB — I-STAT TROPONIN, ED: Troponin i, poc: 0.02 ng/mL (ref 0.00–0.08)

## 2015-01-29 MED ORDER — SODIUM CHLORIDE 0.9 % IV BOLUS (SEPSIS)
500.0000 mL | Freq: Once | INTRAVENOUS | Status: AC
  Administered 2015-01-29: 500 mL via INTRAVENOUS

## 2015-01-29 MED ORDER — CEPHALEXIN 250 MG PO CAPS: 250.0000 mg | ORAL_CAPSULE | Freq: Two times a day (BID) | ORAL | Status: AC

## 2015-01-29 NOTE — ED Provider Notes (Signed)
CSN: [id number]     Arrival date & time 01/29/15  1228 History   First MD Initiated Contact with Patient 01/29/15 1241     Chief Complaint  Patient presents with  . Fall     (Consider location/radiation/quality/duration/timing/severity/associated sxs/prior Treatment) Patient is a 81 y.o. female presenting with fall. The history is provided by the patient.  Fall This is a new problem. Pertinent negatives include no abdominal pain.   patient states she had been in the grocery store and then went outside and felt dizzy. States she then fell, and hit her head. Complaining of slight pain in her left elbow also. No chest pain. No trouble breathing. States she feels better than she did when she fell. She is not been ambulatory yet. No loss of consciousness. No localizing numbness or weakness. No fevers or chills. States she's been doing well otherwise.  Past Medical History  Diagnosis Date  . Hypertension   . Anxiety    History reviewed. No pertinent past surgical history. History reviewed. No pertinent family history. Social History  Substance Use Topics  . Smoking status: Never Smoker   . Smokeless tobacco: None  . Alcohol Use: No   OB History    No data available     Review of Systems  Constitutional: Negative for fever and appetite change.  Respiratory: Negative for chest tightness.   Gastrointestinal: Negative for abdominal pain.  Genitourinary: Negative for dyspareunia.  Musculoskeletal: Negative for back pain.  Skin: Positive for wound.  Neurological: Positive for dizziness. Negative for weakness and numbness.  Psychiatric/Behavioral: Negative for confusion.      Allergies  Review of patient's allergies indicates no known allergies.  Home Medications   Prior to Admission medications   Medication Sig Start Date End Date Taking? Authorizing Provider  ALPRAZolam (XANAX) 0.25 MG tablet Take 0.25 mg by mouth daily.   Yes Historical Provider, MD  aspirin 325 MG tablet  Take 325 mg by mouth daily.   Yes Historical Provider, MD  losartan (COZAAR) 25 MG tablet Take 25 mg by mouth daily.   Yes Historical Provider, MD  mirtazapine (REMERON) 7.5 MG tablet Take 7.5 mg by mouth at bedtime.   Yes Historical Provider, MD  cephALEXin (KEFLEX) 250 MG capsule Take 1 capsule (250 mg total) by mouth 2 (two) times daily. 01/29/15   Davonna Belling, MD   BP 189/69 mmHg  Pulse 88  Temp(Src) 98.1 F (36.7 C) (Oral)  Resp 15  Ht 5\' 6"  (1.676 m)  Wt 137 lb (62.143 kg)  BMI 22.12 kg/m2  SpO2 99% Physical Exam  Constitutional: She appears well-developed.  HENT:  Abrasion to upper forehead. No step-off or deformity.  Eyes: EOM are normal.  Cardiovascular: Normal rate and regular rhythm.   Pulmonary/Chest: Effort normal.  Abdominal: Soft. There is no tenderness.  Musculoskeletal: She exhibits no edema or tenderness.  No tenderness over her elbows knees or hips. No midline cervical tenderness.  Neurological: She is alert.  Skin:  Abrasions to bilateral knees and left elbow. No underlying bony tenderness. Range of motion intact.    ED Course  Procedures (including critical care time) Labs Review Labs Reviewed  URINALYSIS, ROUTINE W REFLEX MICROSCOPIC (NOT AT [facility]) - Abnormal; Notable for the following:    APPearance CLOUDY (*)    Hgb urine dipstick SMALL (*)    Nitrite POSITIVE (*)    Leukocytes, UA LARGE (*)    All other components within normal limits  URINE MICROSCOPIC-ADD ON - Abnormal; Notable  for the following:    Bacteria, UA MANY (*)    All other components within normal limits  I-STAT CHEM 8, ED - Abnormal; Notable for the following:    BUN 27 (*)    Glucose, Bld 122 (*)    All other components within normal limits  URINE CULTURE  CBC WITH DIFFERENTIAL/PLATELET  I-STAT TROPOININ, ED    Imaging Review No results found.   EKG Interpretation   Date/Time:  Thursday January 29 2015 12:36:27 EDT Ventricular Rate:  91 PR Interval:  208 QRS  Duration: 86 QT Interval:  363 QTC Calculation: 447 R Axis:   30 Text Interpretation:  Sinus rhythm Borderline prolonged PR interval  Reconfirmed by Alvino Chapel  MD, Ovid Curd [phone]) on 02/02/2015 2:52:52 PM      MDM   Final diagnoses:  Fall, initial encounter  Acute cystitis without hematuria    Patient got lightheaded and fell. No severe injury from the fall. UTI may have contributed to the lightheadedness. Doubt severe syncopal type symptoms. Will discharge home with antibiotics.    Davonna Belling, MD 02/02/15 1455

## 2015-01-29 NOTE — ED Notes (Signed)
Pt walking to car in parking lot. Pt states she got dizzy, fell face forward. Pt complaining of right hip pain/right knee pain. CBG 123, BP 153/93, HR 98 sinus rhythm, 98% on room air. Pt alert and oriented

## 2015-01-29 NOTE — Discharge Instructions (Signed)

## 2015-01-29 NOTE — ED Notes (Signed)
Pt has skin tear to left elbow and abrasion to center forehead.

## 2015-01-31 LAB — URINE CULTURE

## 2015-02-02 ENCOUNTER — Telehealth (HOSPITAL_COMMUNITY): Payer: Self-pay

## 2015-02-02 NOTE — Telephone Encounter (Signed)
Post ED Visit - Positive Culture Follow-up  Culture report reviewed by antimicrobial stewardship pharmacist: []  Wes Gravette, Pharm.D., BCPS []  Heide Guile, Pharm.D., BCPS []  Alycia Rossetti, Pharm.D., BCPS [x]  [place], Pharm.D., BCPS, AAHIVP []  Legrand Como, Pharm.D., BCPS, AAHIVP []  Isac Sarna, Pharm.D., BCPS  Positive Urine culture, >/= 100,000 colonies -> Proteus Mirabilis Treated with Cephalexin, organism sensitive to the same and no further patient follow-up is required at this time.  Dortha Kern 02/02/2015, 4:54 AM

## 2015-02-17 DIAGNOSIS — Z6823 Body mass index (BMI) 23.0-23.9, adult: Secondary | ICD-10-CM | POA: Diagnosis not present

## 2015-02-17 DIAGNOSIS — D509 Iron deficiency anemia, unspecified: Secondary | ICD-10-CM | POA: Diagnosis not present

## 2015-02-17 DIAGNOSIS — E785 Hyperlipidemia, unspecified: Secondary | ICD-10-CM | POA: Diagnosis not present

## 2015-02-17 DIAGNOSIS — F419 Anxiety disorder, unspecified: Secondary | ICD-10-CM | POA: Diagnosis not present

## 2015-02-17 DIAGNOSIS — I480 Paroxysmal atrial fibrillation: Secondary | ICD-10-CM | POA: Diagnosis not present

## 2015-02-17 DIAGNOSIS — I10 Essential (primary) hypertension: Secondary | ICD-10-CM | POA: Diagnosis not present

## 2015-02-17 DIAGNOSIS — E559 Vitamin D deficiency, unspecified: Secondary | ICD-10-CM | POA: Diagnosis not present

## 2015-02-17 DIAGNOSIS — I714 Abdominal aortic aneurysm, without rupture: Secondary | ICD-10-CM | POA: Diagnosis not present

## 2015-02-17 DIAGNOSIS — Z23 Encounter for immunization: Secondary | ICD-10-CM | POA: Diagnosis not present

## 2015-02-17 DIAGNOSIS — I872 Venous insufficiency (chronic) (peripheral): Secondary | ICD-10-CM | POA: Diagnosis not present

## 2015-02-17 DIAGNOSIS — M859 Disorder of bone density and structure, unspecified: Secondary | ICD-10-CM | POA: Diagnosis not present

## 2015-02-17 DIAGNOSIS — E538 Deficiency of other specified B group vitamins: Secondary | ICD-10-CM | POA: Diagnosis not present

## 2015-07-16 DIAGNOSIS — Z0289 Encounter for other administrative examinations: Secondary | ICD-10-CM | POA: Diagnosis not present

## 2015-07-30 DIAGNOSIS — F4489 Other dissociative and conversion disorders: Secondary | ICD-10-CM | POA: Diagnosis not present

## 2015-07-30 DIAGNOSIS — Z6823 Body mass index (BMI) 23.0-23.9, adult: Secondary | ICD-10-CM | POA: Diagnosis not present

## 2015-07-30 DIAGNOSIS — N390 Urinary tract infection, site not specified: Secondary | ICD-10-CM | POA: Diagnosis not present

## 2015-07-30 DIAGNOSIS — R300 Dysuria: Secondary | ICD-10-CM | POA: Diagnosis not present

## 2015-07-30 DIAGNOSIS — I10 Essential (primary) hypertension: Secondary | ICD-10-CM | POA: Diagnosis not present

## 2015-07-30 DIAGNOSIS — N8111 Cystocele, midline: Secondary | ICD-10-CM | POA: Diagnosis not present

## 2015-08-17 DIAGNOSIS — I872 Venous insufficiency (chronic) (peripheral): Secondary | ICD-10-CM | POA: Diagnosis not present

## 2015-08-17 DIAGNOSIS — F419 Anxiety disorder, unspecified: Secondary | ICD-10-CM | POA: Diagnosis not present

## 2015-08-17 DIAGNOSIS — N8111 Cystocele, midline: Secondary | ICD-10-CM | POA: Diagnosis not present

## 2015-08-17 DIAGNOSIS — I10 Essential (primary) hypertension: Secondary | ICD-10-CM | POA: Diagnosis not present

## 2015-08-17 DIAGNOSIS — F4489 Other dissociative and conversion disorders: Secondary | ICD-10-CM | POA: Diagnosis not present

## 2015-08-17 DIAGNOSIS — Z1389 Encounter for screening for other disorder: Secondary | ICD-10-CM | POA: Diagnosis not present

## 2015-08-17 DIAGNOSIS — Z6822 Body mass index (BMI) 22.0-22.9, adult: Secondary | ICD-10-CM | POA: Diagnosis not present

## 2015-08-17 DIAGNOSIS — M859 Disorder of bone density and structure, unspecified: Secondary | ICD-10-CM | POA: Diagnosis not present

## 2015-08-17 DIAGNOSIS — E7800 Pure hypercholesterolemia, unspecified: Secondary | ICD-10-CM | POA: Diagnosis not present

## 2015-08-17 DIAGNOSIS — I714 Abdominal aortic aneurysm, without rupture: Secondary | ICD-10-CM | POA: Diagnosis not present

## 2015-08-17 DIAGNOSIS — I480 Paroxysmal atrial fibrillation: Secondary | ICD-10-CM | POA: Diagnosis not present

## 2015-08-17 DIAGNOSIS — E559 Vitamin D deficiency, unspecified: Secondary | ICD-10-CM | POA: Diagnosis not present

## 2015-08-21 ENCOUNTER — Other Ambulatory Visit: Payer: Self-pay | Admitting: Internal Medicine

## 2015-08-21 DIAGNOSIS — I714 Abdominal aortic aneurysm, without rupture, unspecified: Secondary | ICD-10-CM

## 2015-09-02 DIAGNOSIS — M859 Disorder of bone density and structure, unspecified: Secondary | ICD-10-CM | POA: Diagnosis not present

## 2015-09-10 ENCOUNTER — Other Ambulatory Visit (HOSPITAL_COMMUNITY): Payer: Self-pay | Admitting: *Deleted

## 2015-09-11 ENCOUNTER — Ambulatory Visit (HOSPITAL_COMMUNITY)
Admission: RE | Admit: 2015-09-11 | Discharge: 2015-09-11 | Disposition: A | Discharge status: Home / Self Care | Payer: Medicare Other | Source: Ambulatory Visit | Attending: Internal Medicine | Admitting: Internal Medicine

## 2015-09-11 DIAGNOSIS — M810 Age-related osteoporosis without current pathological fracture: Secondary | ICD-10-CM | POA: Insufficient documentation

## 2015-09-11 MED ORDER — ZOLEDRONIC ACID 5 MG/100ML IV SOLN
5.0000 mg | Freq: Once | INTRAVENOUS | Status: AC
  Administered 2015-09-11: 5 mg via INTRAVENOUS

## 2015-09-11 MED ORDER — ZOLEDRONIC ACID 5 MG/100ML IV SOLN
INTRAVENOUS | Status: AC
  Filled 2015-09-11: qty 100

## 2015-09-15 ENCOUNTER — Ambulatory Visit: Payer: Medicare Other | Admitting: Diagnostic Neuroimaging

## 2015-10-30 DIAGNOSIS — Z0000 Encounter for general adult medical examination without abnormal findings: Secondary | ICD-10-CM | POA: Diagnosis not present

## 2015-10-30 DIAGNOSIS — R350 Frequency of micturition: Secondary | ICD-10-CM | POA: Diagnosis not present

## 2015-10-30 DIAGNOSIS — N3020 Other chronic cystitis without hematuria: Secondary | ICD-10-CM | POA: Diagnosis not present

## 2015-10-30 DIAGNOSIS — N3941 Urge incontinence: Secondary | ICD-10-CM | POA: Diagnosis not present

## 2015-12-11 DIAGNOSIS — F419 Anxiety disorder, unspecified: Secondary | ICD-10-CM | POA: Diagnosis not present

## 2015-12-11 DIAGNOSIS — I10 Essential (primary) hypertension: Secondary | ICD-10-CM | POA: Diagnosis not present

## 2015-12-11 DIAGNOSIS — Z6822 Body mass index (BMI) 22.0-22.9, adult: Secondary | ICD-10-CM | POA: Diagnosis not present

## 2015-12-11 DIAGNOSIS — M859 Disorder of bone density and structure, unspecified: Secondary | ICD-10-CM | POA: Diagnosis not present

## 2015-12-11 DIAGNOSIS — N8111 Cystocele, midline: Secondary | ICD-10-CM | POA: Diagnosis not present

## 2015-12-11 DIAGNOSIS — I480 Paroxysmal atrial fibrillation: Secondary | ICD-10-CM | POA: Diagnosis not present

## 2015-12-11 DIAGNOSIS — E559 Vitamin D deficiency, unspecified: Secondary | ICD-10-CM | POA: Diagnosis not present

## 2015-12-11 DIAGNOSIS — I872 Venous insufficiency (chronic) (peripheral): Secondary | ICD-10-CM | POA: Diagnosis not present

## 2015-12-11 DIAGNOSIS — F4489 Other dissociative and conversion disorders: Secondary | ICD-10-CM | POA: Diagnosis not present

## 2015-12-11 DIAGNOSIS — N3941 Urge incontinence: Secondary | ICD-10-CM | POA: Diagnosis not present

## 2015-12-11 DIAGNOSIS — E7800 Pure hypercholesterolemia, unspecified: Secondary | ICD-10-CM | POA: Diagnosis not present

## 2015-12-23 DIAGNOSIS — N1330 Unspecified hydronephrosis: Secondary | ICD-10-CM | POA: Diagnosis not present

## 2016-01-22 DIAGNOSIS — N281 Cyst of kidney, acquired: Secondary | ICD-10-CM | POA: Diagnosis not present

## 2016-01-22 DIAGNOSIS — K4090 Unilateral inguinal hernia, without obstruction or gangrene, not specified as recurrent: Secondary | ICD-10-CM | POA: Diagnosis not present

## 2016-02-24 DIAGNOSIS — N3941 Urge incontinence: Secondary | ICD-10-CM | POA: Diagnosis not present

## 2016-02-24 DIAGNOSIS — R350 Frequency of micturition: Secondary | ICD-10-CM | POA: Diagnosis not present

## 2016-02-26 DIAGNOSIS — M71571 Other bursitis, not elsewhere classified, right ankle and foot: Secondary | ICD-10-CM | POA: Diagnosis not present

## 2016-02-26 DIAGNOSIS — I739 Peripheral vascular disease, unspecified: Secondary | ICD-10-CM | POA: Diagnosis not present

## 2016-02-26 DIAGNOSIS — M79672 Pain in left foot: Secondary | ICD-10-CM | POA: Diagnosis not present

## 2016-02-26 DIAGNOSIS — M79671 Pain in right foot: Secondary | ICD-10-CM | POA: Diagnosis not present

## 2016-02-26 DIAGNOSIS — L603 Nail dystrophy: Secondary | ICD-10-CM | POA: Diagnosis not present

## 2016-03-14 DIAGNOSIS — Z23 Encounter for immunization: Secondary | ICD-10-CM | POA: Diagnosis not present

## 2016-03-14 DIAGNOSIS — I480 Paroxysmal atrial fibrillation: Secondary | ICD-10-CM | POA: Diagnosis not present

## 2016-03-14 DIAGNOSIS — E7800 Pure hypercholesterolemia, unspecified: Secondary | ICD-10-CM | POA: Diagnosis not present

## 2016-03-14 DIAGNOSIS — E559 Vitamin D deficiency, unspecified: Secondary | ICD-10-CM | POA: Diagnosis not present

## 2016-03-14 DIAGNOSIS — I10 Essential (primary) hypertension: Secondary | ICD-10-CM | POA: Diagnosis not present

## 2016-03-14 DIAGNOSIS — F4489 Other dissociative and conversion disorders: Secondary | ICD-10-CM | POA: Diagnosis not present

## 2016-03-14 DIAGNOSIS — N8111 Cystocele, midline: Secondary | ICD-10-CM | POA: Diagnosis not present

## 2016-03-14 DIAGNOSIS — I714 Abdominal aortic aneurysm, without rupture: Secondary | ICD-10-CM | POA: Diagnosis not present

## 2016-03-14 DIAGNOSIS — D508 Other iron deficiency anemias: Secondary | ICD-10-CM | POA: Diagnosis not present

## 2016-03-14 DIAGNOSIS — E538 Deficiency of other specified B group vitamins: Secondary | ICD-10-CM | POA: Diagnosis not present

## 2016-03-14 DIAGNOSIS — I872 Venous insufficiency (chronic) (peripheral): Secondary | ICD-10-CM | POA: Diagnosis not present

## 2016-03-22 DIAGNOSIS — L570 Actinic keratosis: Secondary | ICD-10-CM | POA: Diagnosis not present

## 2016-03-22 DIAGNOSIS — X32XXXA Exposure to sunlight, initial encounter: Secondary | ICD-10-CM | POA: Diagnosis not present

## 2016-03-22 DIAGNOSIS — D0439 Carcinoma in situ of skin of other parts of face: Secondary | ICD-10-CM | POA: Diagnosis not present

## 2016-03-30 ENCOUNTER — Encounter: Payer: Self-pay | Admitting: Neurology

## 2016-03-30 ENCOUNTER — Telehealth: Payer: Self-pay | Admitting: Neurology

## 2016-03-30 ENCOUNTER — Ambulatory Visit (INDEPENDENT_AMBULATORY_CARE_PROVIDER_SITE_OTHER): Payer: Medicare Other | Admitting: Neurology

## 2016-03-30 DIAGNOSIS — R269 Unspecified abnormalities of gait and mobility: Secondary | ICD-10-CM | POA: Diagnosis not present

## 2016-03-30 DIAGNOSIS — R200 Anesthesia of skin: Secondary | ICD-10-CM | POA: Diagnosis not present

## 2016-03-30 DIAGNOSIS — R413 Other amnesia: Secondary | ICD-10-CM | POA: Diagnosis not present

## 2016-03-30 DIAGNOSIS — R3915 Urgency of urination: Secondary | ICD-10-CM | POA: Diagnosis not present

## 2016-03-30 MED ORDER — DONEPEZIL HCL 5 MG PO TABS: 5.0000 mg | ORAL_TABLET | Freq: Every day | ORAL | 5 refills | Status: AC

## 2016-03-30 NOTE — Telephone Encounter (Signed)
Dorothy Doyle called stating the pt is to have a MRI and can not sit still, very anxious. Is it necessary for her to have test. If what can be done to help. Please call [phone]

## 2016-03-30 NOTE — Progress Notes (Signed)
[facility]  PATIENT: Dorothy Doyle DOB: 1924-07-09  REFERRING DOCTOR OR PCP:  Dr. Osborne Casco SOURCE: Patient, daughter, notes from Dr. Osborne Casco, CT report, CT head images on PACS.  _________________________________   HISTORICAL  CHIEF COMPLAINT:  Chief Complaint  Patient presents with  . Memory Loss    Dorothy Doyle is here with her dtr. Dorothy Doyle for eval of memory loss, first noted about 2 yrs. ago.  She has not been on any memory meds.Dorothy Doyle  . [place]    Pt. declined to try the visuospatial/executive portion of MOCA./fim    HISTORY OF PRESENT ILLNESS:  I had the pleasure seeing you patient, Dorothy Doyle, at [facility] for neurologic consultation regarding her memory difficulties and poor gait.  She began to have some difficulties with her memory about 2 years ago. The changes have been gradual and was first noted with some short-term memory loss. Over the last year she has done fewer tasks that she used to do independently. Specifically, she is not cooking anymore and about 6 months ago stopped driving. She lives by herself but her daughter does stop by every day. She has needed more reminders to do things. Because of that they have set up some systems of writing down reminders.   Her daughter also makes sure her pills are laid out..   Because she has left the stove on in the past the stove has been disconnected.  She is set her up for life alert so she does not have to remember phone numbers if she has a fall.  She also has had some difficulty with her gait and that has gradually worsened over the last 2 or 3 years. She fell while shopping a couple years ago and went from a cane to mostly using a walker at that time. She still has fallen a few times while using the walker though there are no recent falls.   She has never hurt herself with the fall. The daughter notes that the size of her step is less now than it was a couple years ago but that it has not  drastically change this year.  She has had regression urgency and rare incontinence when she cannot get to the bathroom in time. She has seen urology this year.  Montreal Cognitive Assessment  03/30/2016  Visuospatial/ Executive (0/5) 0  Naming (0/3) 1  Attention: Read list of digits (0/2) 1  Attention: Read list of letters (0/1) 1  Attention: Serial 7 subtraction starting at 100 (0/3) 0  Language: Repeat phrase (0/2) 2  Language : Fluency (0/1) 0  Abstraction (0/2) 2  Delayed Recall (0/5) 0  Orientation (0/6) 3  Total 10  Adjusted Score (based on education) 11     She had a head CT scan August 2016. I personally reviewed the images. It shows mild cortical atrophy that is more pronounced in the mesial temporal lobes. Additionally, she has some hypodensefoci consistent with chronic microvascular ischemic change related to aging. There did not appear to be any acute findings.  REVIEW OF SYSTEMS: Constitutional: No fevers, chills, sweats, or change in appetite Eyes: No visual changes, double vision, eye pain Ear, nose and throat: No hearing loss, ear pain, nasal congestion, sore throat Cardiovascular: No chest pain, palpitations Respiratory: No shortness of breath at rest or with exertion.   No wheezes GastrointestinaI: No nausea, vomiting, diarrhea, abdominal pain, fecal incontinence Genitourinary: She has urinary frequency and is seeing urology. Musculoskeletal: No neck pain, back pain Integumentary:  She has a left temple skin lesion that has been seen by dermatology. Neurological: as above Psychiatric: No depression at this time.  No anxiety Endocrine: No palpitations, diaphoresis, change in appetite, change in weigh or increased thirst Hematologic/Lymphatic: No anemia, purpura, petechiae. Allergic/Immunologic: No itchy/runny eyes, nasal congestion, recent allergic reactions, rashes  ALLERGIES: No Known Allergies  HOME MEDICATIONS:  Current Outpatient Prescriptions:  .   aspirin 325 MG tablet, Take 325 mg by mouth daily., Disp: , Rfl:  .  losartan (COZAAR) 25 MG tablet, Take 25 mg by mouth daily., Disp: , Rfl:  .  ALPRAZolam (XANAX) 0.25 MG tablet, Take 0.25 mg by mouth daily., Disp: , Rfl:  .  cephALEXin (KEFLEX) 250 MG capsule, Take 1 capsule (250 mg total) by mouth 2 (two) times daily. (Patient not taking: Reported on 03/30/2016), Disp: 10 capsule, Rfl: 0 .  donepezil (ARICEPT) 5 MG tablet, Take 1 tablet (5 mg total) by mouth at bedtime., Disp: 30 tablet, Rfl: 5 .  mirtazapine (REMERON) 7.5 MG tablet, Take 7.5 mg by mouth at bedtime., Disp: , Rfl:   PAST MEDICAL HISTORY: Past Medical History:  Diagnosis Date  . Anxiety   . Hypertension     PAST SURGICAL HISTORY: No past surgical history on file.  FAMILY HISTORY: No family history on file.  SOCIAL HISTORY:  Social History   Social History  . Marital status: Married    Spouse name: N/A  . Number of children: N/A  . Years of education: N/A   Occupational History  . Not on file.   Social History Main Topics  . Smoking status: Never Smoker  . Smokeless tobacco: Not on file  . Alcohol use No  . Drug use: No  . Sexual activity: Not on file   Other Topics Concern  . Not on file   Social History Narrative  . No narrative on file     PHYSICAL EXAM  Vitals:   03/30/16 1416  BP: 136/88  Pulse: 76  Resp: 18  Weight: 127 lb 8 oz (57.8 kg)  Height: 5' 4"  (1.626 m)    Body mass index is 21.89 kg/m.   General: The patient is well-developed and well-nourished and in no acute distress  Eyes:  Undilated funduscopic exam can't be done due to very small pupil size..  Neck: The neck is supple, no carotid bruits are noted.  The neck is nontender. Range of motion is slightly reduced.  Cardiovascular: The heart has a regular rate and rhythm with a normal S1 and S2. There were no murmurs, gallops or rubs. Lungs are clear to auscultation.  Musculoskeletal:  Back is  nontender  Neurologic Exam  Mental status: The patient is alert and oriented x 2 at the time of the examination. The patient has reduced recent memory, with a reduced attention span and concentration ability.  Details in MoCa test results above.   Speech is normal.  Cranial nerves: Extraocular movements are full. Pupils are equal, round, and reactive to light and accomodation.    There is good facial sensation to soft touch bilaterally.Facial strength is normal.  Trapezius and sternocleidomastoid strength is normal. No dysarthria is noted.  The tongue is midline, and the patient has symmetric elevation of the soft palate. No obvious hearing deficits are noted.  Motor:  Muscle bulk is normal.   Tone is normal. Strength is  5 / 5 in all 4 extremities.   Sensory: Sensory testing is intact to pinprick, soft touch and vibration sensation  in the arms but she has reduced sensation to vibration in the feet..  Coordination: Cerebellar testing reveals good finger-nose-finger and reduced heel-to-shin bilaterally.  Gait and station: Station is normal.   As reduced stride she uses a walker. She can turn around in 4 steps using her walker.  Reflexes: Deep tendon reflexes are symmetric and normal bilaterally.   Plantar responses are flexor.     DIAGNOSTIC DATA (LABS, IMAGING, TESTING) - I reviewed patient records, labs, notes, testing and imaging myself where available.  Lab Results  Component Value Date   WBC 7.9 01/29/2015   HGB 13.9 01/29/2015   HCT 41.0 01/29/2015   MCV 92.7 01/29/2015   PLT 225 01/29/2015      Component Value Date/Time   NA 139 01/29/2015 1326   K 3.9 01/29/2015 1326   CL 104 01/29/2015 1326   GLUCOSE 122 (H) 01/29/2015 1326   BUN 27 (H) 01/29/2015 1326   CREATININE 0.80 01/29/2015 1326       ASSESSMENT AND PLAN  Memory loss - Plan: MR Brain Wo Contrast, Vitamin B12, Sedimentation rate, Comprehensive metabolic panel, CBC with Differential/Platelets  Gait  disturbance - Plan: MR Brain Wo Contrast  Urinary urgency - Plan: MR Brain Wo Contrast  Numbness   In summary, Lillyona Polasek is an 82 year old woman with progressive memory disturbances and other cognitive difficulties over the last 2 years. She also has aggressive gait worsening over the past 2-3 years. Her performance on the [place] cognitive assessment would be consistent with Alzheimer's disease.    However, since she also has gait difficulty and numbness in her feet we will check some lab work (B12, ESR, CMP, CBC) and a noncontrasted MRI of the brain.   If gait worsens, consider an MRI of the cervical spine to assess for the possibility of spinal stenosis.  However, given her advanced age she would have to have a significant acute change to warrant repeat.  I started her on donepezil 5 mg by mouth daily and that can be increased to 10 mg if tolerated well.  She will return to see me in 4 months or sooner if there are new or worsening neurologic symptoms.  Thank you for asking me to see Mrs. Manz. Please let me know if I can be of further assistance with her or other patients in the future.     Richard A. Felecia Shelling, MD, PhD 81/07/5484, 2:82 PM Certified in Neurology, Clinical Neurophysiology, Sleep Medicine, Pain Medicine and Neuroimaging  [facility] [address], [place], [place] [address] [phone]

## 2016-03-31 ENCOUNTER — Telehealth: Payer: Self-pay

## 2016-03-31 LAB — CBC WITH DIFFERENTIAL/PLATELET
BASOS: 0 %
Basophils Absolute: 0 10*3/uL (ref 0.0–0.2)
EOS (ABSOLUTE): 0.1 10*3/uL (ref 0.0–0.4)
Eos: 1 %
Hematocrit: 38.4 % (ref 34.0–46.6)
Hemoglobin: 13.1 g/dL (ref 11.1–15.9)
IMMATURE GRANS (ABS): 0 10*3/uL (ref 0.0–0.1)
Immature Granulocytes: 0 %
Lymphocytes Absolute: 2 10*3/uL (ref 0.7–3.1)
Lymphs: 30 %
MCH: 30.8 pg (ref 26.6–33.0)
MCHC: 34.1 g/dL (ref 31.5–35.7)
MCV: 90 fL (ref 79–97)
MONOS ABS: 0.4 10*3/uL (ref 0.1–0.9)
Monocytes: 6 %
NEUTROS ABS: 4.1 10*3/uL (ref 1.4–7.0)
Neutrophils: 63 %
PLATELETS: 269 10*3/uL (ref 150–379)
RBC: 4.25 x10E6/uL (ref 3.77–5.28)
RDW: 12.9 % (ref 12.3–15.4)
WBC: 6.7 10*3/uL (ref 3.4–10.8)

## 2016-03-31 LAB — COMPREHENSIVE METABOLIC PANEL
A/G RATIO: 2.1 (ref 1.2–2.2)
ALT: 8 IU/L (ref 0–32)
AST: 13 IU/L (ref 0–40)
Albumin: 4.1 g/dL (ref 3.5–4.7)
Alkaline Phosphatase: 117 IU/L (ref 39–117)
BUN/Creatinine Ratio: 20 (ref 12–28)
BUN: 18 mg/dL (ref 8–27)
Bilirubin Total: 0.2 mg/dL (ref 0.0–1.2)
CALCIUM: 9.6 mg/dL (ref 8.7–10.3)
CO2: 26 mmol/L (ref 18–29)
Chloride: 101 mmol/L (ref 96–106)
Creatinine, Ser: 0.88 mg/dL (ref 0.57–1.00)
GFR, EST AFRICAN AMERICAN: 67 mL/min/{1.73_m2} (ref >59)
GFR, EST NON AFRICAN AMERICAN: 58 mL/min/{1.73_m2} — AB (ref >59)
GLOBULIN, TOTAL: 2 g/dL (ref 1.5–4.5)
Glucose: 103 mg/dL — ABNORMAL HIGH (ref 65–99)
POTASSIUM: 4.8 mmol/L (ref 3.5–5.2)
SODIUM: 143 mmol/L (ref 134–144)
TOTAL PROTEIN: 6.1 g/dL (ref 6.0–8.5)

## 2016-03-31 LAB — TSH: TSH: 1.44 u[IU]/mL (ref 0.450–4.500)

## 2016-03-31 LAB — SEDIMENTATION RATE: Sed Rate: 2 mm/hr (ref 0–40)

## 2016-03-31 LAB — VITAMIN B12: VITAMIN B 12: 272 pg/mL (ref 211–946)

## 2016-03-31 NOTE — Telephone Encounter (Signed)
I spoke to daughter and she is aware of results.

## 2016-03-31 NOTE — Telephone Encounter (Signed)
I have spoken with [place] today.  Dorothy Doyle has Xanax on her med list. Greg[place] Signs sts. she will check to see if she still has this at home.  If so, will give one po 30 min. prior to MRI.  She will call back if pt. does not have Xanax at home/fim

## 2016-04-14 ENCOUNTER — Inpatient Hospital Stay: Admission: RE | Admit: 2016-04-14 | Payer: Medicare Other | Source: Ambulatory Visit

## 2016-05-19 DIAGNOSIS — Z961 Presence of intraocular lens: Secondary | ICD-10-CM | POA: Diagnosis not present

## 2016-05-19 DIAGNOSIS — H353131 Nonexudative age-related macular degeneration, bilateral, early dry stage: Secondary | ICD-10-CM | POA: Diagnosis not present

## 2016-05-19 DIAGNOSIS — H04123 Dry eye syndrome of bilateral lacrimal glands: Secondary | ICD-10-CM | POA: Diagnosis not present

## 2016-05-19 DIAGNOSIS — H35033 Hypertensive retinopathy, bilateral: Secondary | ICD-10-CM | POA: Diagnosis not present

## 2016-06-09 DIAGNOSIS — I10 Essential (primary) hypertension: Secondary | ICD-10-CM | POA: Diagnosis not present

## 2016-06-09 DIAGNOSIS — E7800 Pure hypercholesterolemia, unspecified: Secondary | ICD-10-CM | POA: Diagnosis not present

## 2016-06-09 DIAGNOSIS — I714 Abdominal aortic aneurysm, without rupture: Secondary | ICD-10-CM | POA: Diagnosis not present

## 2016-06-09 DIAGNOSIS — I872 Venous insufficiency (chronic) (peripheral): Secondary | ICD-10-CM | POA: Diagnosis not present

## 2016-06-09 DIAGNOSIS — R413 Other amnesia: Secondary | ICD-10-CM | POA: Diagnosis not present

## 2016-06-09 DIAGNOSIS — Z6821 Body mass index (BMI) 21.0-21.9, adult: Secondary | ICD-10-CM | POA: Diagnosis not present

## 2016-06-09 DIAGNOSIS — D508 Other iron deficiency anemias: Secondary | ICD-10-CM | POA: Diagnosis not present

## 2016-06-09 DIAGNOSIS — I7389 Other specified peripheral vascular diseases: Secondary | ICD-10-CM | POA: Diagnosis not present

## 2016-06-09 DIAGNOSIS — R42 Dizziness and giddiness: Secondary | ICD-10-CM | POA: Diagnosis not present

## 2016-06-09 DIAGNOSIS — E559 Vitamin D deficiency, unspecified: Secondary | ICD-10-CM | POA: Diagnosis not present

## 2016-06-09 DIAGNOSIS — M859 Disorder of bone density and structure, unspecified: Secondary | ICD-10-CM | POA: Diagnosis not present

## 2016-06-09 DIAGNOSIS — F418 Other specified anxiety disorders: Secondary | ICD-10-CM | POA: Diagnosis not present

## 2016-06-30 DIAGNOSIS — R310 Gross hematuria: Secondary | ICD-10-CM | POA: Diagnosis not present

## 2016-06-30 DIAGNOSIS — N3020 Other chronic cystitis without hematuria: Secondary | ICD-10-CM | POA: Diagnosis not present

## 2016-06-30 IMAGING — DX DG CHEST 2V
2 series · 2 of 2 positions shown · non-contrast
Comparison: None.

CLINICAL DATA: Dizzy tube.  Weakness.

EXAM:
CHEST  2 VIEW

[w chest lat]
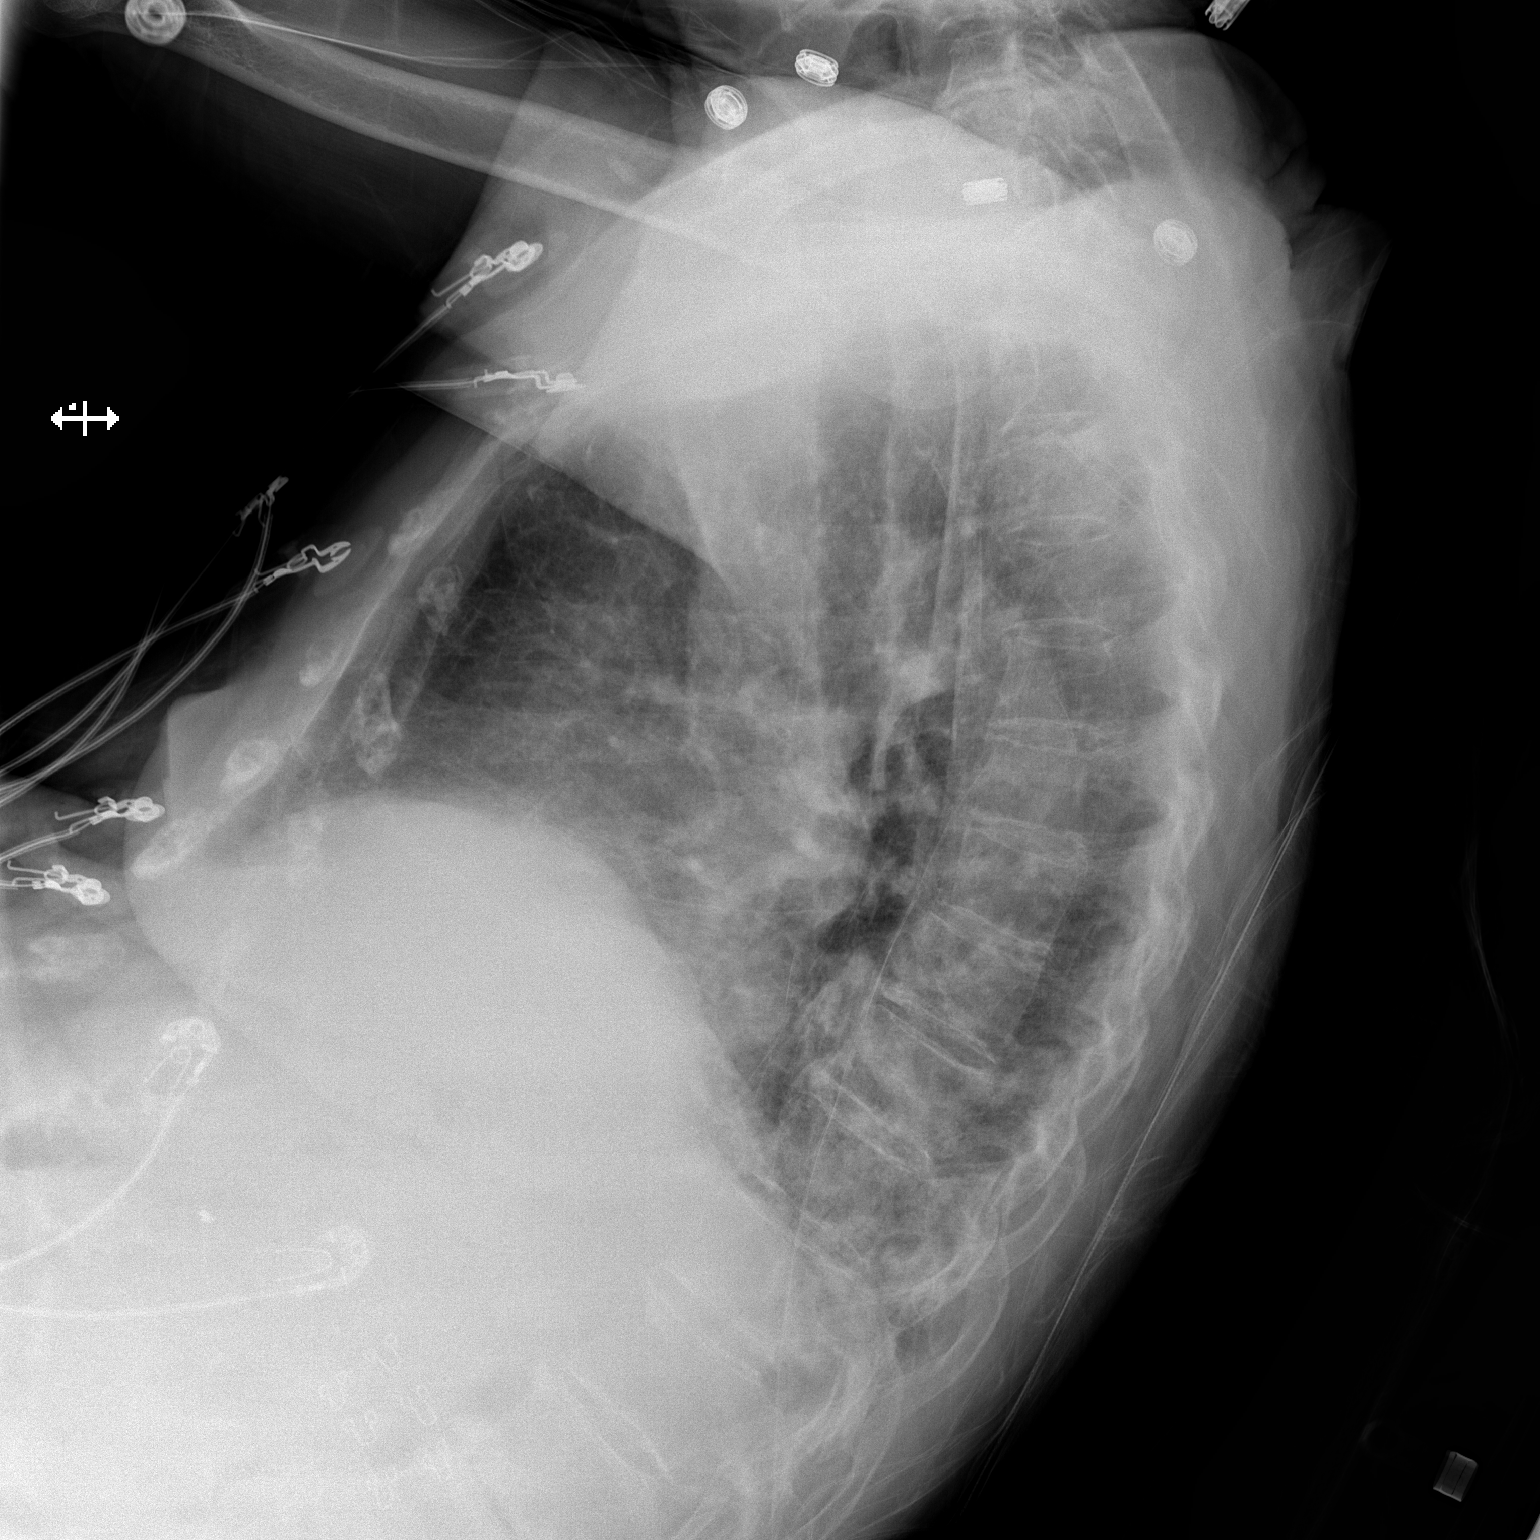

[x chest ap]
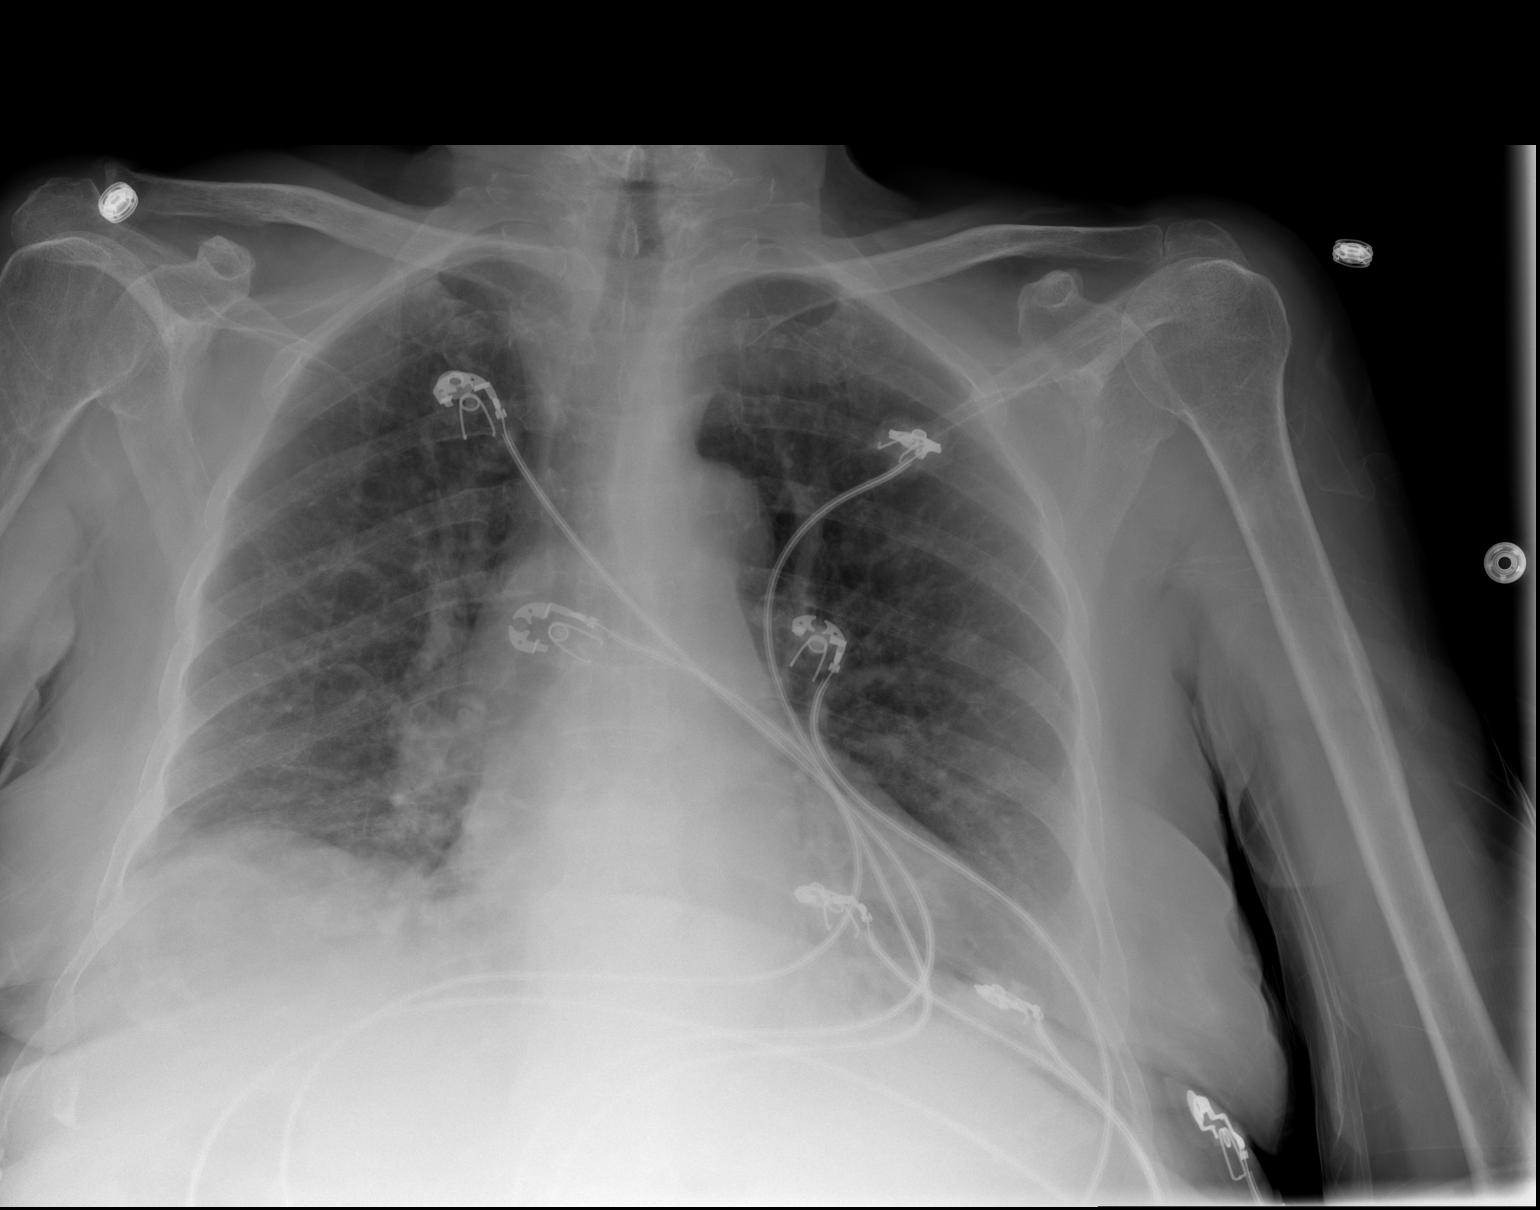

[2 of 2 positions shown; findings below may reference images not displayed]

FINDINGS: Cardiac silhouette is mildly enlarged. No mediastinal or hilar
masses or evidence of adenopathy.

There are prominent bronchovascular markings most evident in the
lung bases. No lung consolidation or edema. No pleural effusion or
pneumothorax.

Bony thorax is demineralized but grossly intact.
IMPRESSION: No acute cardiopulmonary disease.

## 2016-07-18 DIAGNOSIS — N3020 Other chronic cystitis without hematuria: Secondary | ICD-10-CM | POA: Diagnosis not present

## 2016-07-18 DIAGNOSIS — R310 Gross hematuria: Secondary | ICD-10-CM | POA: Diagnosis not present

## 2016-07-29 ENCOUNTER — Emergency Department (HOSPITAL_COMMUNITY)
Admission: EM | Admit: 2016-07-29 | Discharge: 2016-07-30 | Disposition: A | Discharge status: Home / Self Care | Payer: Medicare Other | Attending: Emergency Medicine | Admitting: Emergency Medicine

## 2016-07-29 DIAGNOSIS — K2970 Gastritis, unspecified, without bleeding: Secondary | ICD-10-CM | POA: Diagnosis not present

## 2016-07-29 DIAGNOSIS — N390 Urinary tract infection, site not specified: Secondary | ICD-10-CM | POA: Diagnosis not present

## 2016-07-29 DIAGNOSIS — R112 Nausea with vomiting, unspecified: Secondary | ICD-10-CM | POA: Diagnosis not present

## 2016-07-29 DIAGNOSIS — Z79899 Other long term (current) drug therapy: Secondary | ICD-10-CM | POA: Insufficient documentation

## 2016-07-29 DIAGNOSIS — R109 Unspecified abdominal pain: Secondary | ICD-10-CM | POA: Diagnosis present

## 2016-07-29 LAB — URINALYSIS, ROUTINE W REFLEX MICROSCOPIC
Bilirubin Urine: NEGATIVE
Glucose, UA: NEGATIVE mg/dL
Hgb urine dipstick: NEGATIVE
Ketones, ur: NEGATIVE mg/dL
Nitrite: POSITIVE — AB
Protein, ur: NEGATIVE mg/dL
SPECIFIC GRAVITY, URINE: 1.01 (ref 1.005–1.030)
pH: 7 (ref 5.0–8.0)

## 2016-07-29 MED ORDER — CIPROFLOXACIN HCL 500 MG PO TABS: 500.0000 mg | ORAL_TABLET | Freq: Two times a day (BID) | ORAL | 0 refills | Status: AC

## 2016-07-29 NOTE — ED Provider Notes (Signed)
[place] DEPT Provider Note   CSN: [id number] Arrival date & time: 07/29/16  1916     History   Chief Complaint Chief Complaint  Patient presents with  . Abdominal Pain    HPI Dorothy Doyle is a 83 y.o. female.  Patient is a 83 year old female who complains of abdominal pain. She lives independently with family members close by. Her son states that EMS was called to her house because she had an episode of nausea and one episode of vomiting. She currently denies any symptoms. She denies abdominal pain. She doesn't feel nauseous. Her son feels that her symptoms could be related to anxiety as she was fairly stressed today because she lost one of her portable phones. She hasn't had any recent fevers. No urinary symptoms. However she does have a history of frequent UTIs. She denies any cough or chest congestion. She denies any chest pain or shortness of breath.      No past medical history on file.  There are no active problems to display for this patient.   No past surgical history on file.  OB History    No data available       Home Medications    Prior to Admission medications   Medication Sig Start Date End Date Taking? Authorizing Provider  ciprofloxacin (CIPRO) 500 MG tablet Take 1 tablet (500 mg total) by mouth 2 (two) times daily. One po bid x 7 days 07/29/16   Malvin Johns, MD    Family History No family history on file.  Social History Social History  Substance Use Topics  . Smoking status: Not on file  . Smokeless tobacco: Not on file  . Alcohol use Not on file     Allergies   Patient has no allergy information on record.   Review of Systems Review of Systems  Constitutional: Negative for chills, diaphoresis, fatigue and fever.  HENT: Negative for congestion, rhinorrhea and sneezing.   Eyes: Negative.   Respiratory: Negative for cough, chest tightness and shortness of breath.   Cardiovascular: Negative for chest pain and leg swelling.    Gastrointestinal: Positive for abdominal pain, nausea and vomiting. Negative for blood in stool and diarrhea.  Genitourinary: Negative for difficulty urinating, flank pain, frequency and hematuria.  Musculoskeletal: Negative for arthralgias and back pain.  Skin: Negative for rash.  Neurological: Negative for dizziness, speech difficulty, weakness, numbness and headaches.     Physical Exam Updated Vital Signs BP 110/55 (BP Location: Left Arm)   Pulse 83   Temp 97.7 F (36.5 C) (Oral)   Resp 16   SpO2 98%   Physical Exam  Constitutional: She is oriented to person, place, and time. She appears well-developed and well-nourished.  HENT:  Head: Normocephalic and atraumatic.  Eyes: Pupils are equal, round, and reactive to light.  Neck: Normal range of motion. Neck supple.  Cardiovascular: Normal rate, regular rhythm and normal heart sounds.   Pulmonary/Chest: Effort normal and breath sounds normal. No respiratory distress. She has no wheezes. She has no rales. She exhibits no tenderness.  Abdominal: Soft. Bowel sounds are normal. There is no tenderness. There is no rebound and no guarding.  Musculoskeletal: Normal range of motion. She exhibits no edema.  Lymphadenopathy:    She has no cervical adenopathy.  Neurological: She is alert and oriented to person, place, and time.  Skin: Skin is warm and dry. No rash noted.  Psychiatric: She has a normal mood and affect.     ED Treatments /  Results  Labs (all labs ordered are listed, but only abnormal results are displayed) Labs Reviewed  URINALYSIS, ROUTINE W REFLEX MICROSCOPIC - Abnormal; Notable for the following:       Result Value   Nitrite POSITIVE (*)    Leukocytes, UA LARGE (*)    Bacteria, UA RARE (*)    Squamous Epithelial / LPF 0-5 (*)    All other components within normal limits  URINE CULTURE    EKG  EKG Interpretation None       Radiology No results found.  Procedures Procedures (including critical care  time)  Medications Ordered in ED Medications - No data to display   Initial Impression / Assessment and Plan / ED Course  I have reviewed the triage vital signs and the nursing notes.  Pertinent labs & imaging results that were available during my care of the patient were reviewed by me and considered in my medical decision making (see chart for details).     Patient has evidence of a UTI. She's otherwise well-appearing. There is no abdominal tenderness. She's had no vomiting in the ED. She is tolerating fluids. She was started on Cipro. She was encouraged to follow-up with her urologist. Return precautions were given.  Final Clinical Impressions(s) / ED Diagnoses   Final diagnoses:  Lower urinary tract infectious disease    New Prescriptions Discharge Medication List as of 07/29/2016 11:53 PM    START taking these medications   Details  ciprofloxacin (CIPRO) 500 MG tablet Take 1 tablet (500 mg total) by mouth 2 (two) times daily. One po bid x 7 days, Starting Fri 07/29/2016, Print         Malvin Johns, MD 07/30/16 [phone]

## 2016-07-29 NOTE — ED Notes (Signed)
Bed: [id number] Expected date:  Expected time:  Means of arrival:  Comments: 90 EMS abd pain

## 2016-07-29 NOTE — ED Triage Notes (Addendum)
Per EMS, pt complains of abdominal pain, nausea, some vomiting for the past 2 hours. Pt denies SOB.   Pt given 4mg  zofran

## 2016-07-29 NOTE — ED Notes (Signed)
Pt had a cup of water and did well

## 2016-07-31 LAB — URINE CULTURE

## 2016-08-01 ENCOUNTER — Ambulatory Visit: Payer: Medicare Other | Admitting: Neurology

## 2016-08-10 DIAGNOSIS — N3020 Other chronic cystitis without hematuria: Secondary | ICD-10-CM | POA: Diagnosis not present

## 2016-09-02 DIAGNOSIS — G309 Alzheimer's disease, unspecified: Secondary | ICD-10-CM | POA: Diagnosis not present

## 2016-09-02 DIAGNOSIS — Z7389 Other problems related to life management difficulty: Secondary | ICD-10-CM | POA: Diagnosis not present

## 2016-09-02 DIAGNOSIS — R269 Unspecified abnormalities of gait and mobility: Secondary | ICD-10-CM | POA: Diagnosis not present

## 2016-09-02 DIAGNOSIS — Z6821 Body mass index (BMI) 21.0-21.9, adult: Secondary | ICD-10-CM | POA: Diagnosis not present

## 2016-09-02 DIAGNOSIS — N3941 Urge incontinence: Secondary | ICD-10-CM | POA: Diagnosis not present

## 2016-09-19 DIAGNOSIS — R05 Cough: Secondary | ICD-10-CM | POA: Diagnosis not present

## 2016-09-19 DIAGNOSIS — J3089 Other allergic rhinitis: Secondary | ICD-10-CM | POA: Diagnosis not present

## 2016-09-19 DIAGNOSIS — Z6821 Body mass index (BMI) 21.0-21.9, adult: Secondary | ICD-10-CM | POA: Diagnosis not present

## 2016-09-19 DIAGNOSIS — R300 Dysuria: Secondary | ICD-10-CM | POA: Diagnosis not present

## 2016-10-26 DIAGNOSIS — N3020 Other chronic cystitis without hematuria: Secondary | ICD-10-CM | POA: Diagnosis not present

## 2016-11-28 DIAGNOSIS — Z6821 Body mass index (BMI) 21.0-21.9, adult: Secondary | ICD-10-CM | POA: Diagnosis not present

## 2016-11-28 DIAGNOSIS — R2689 Other abnormalities of gait and mobility: Secondary | ICD-10-CM | POA: Diagnosis not present

## 2016-11-28 DIAGNOSIS — N8111 Cystocele, midline: Secondary | ICD-10-CM | POA: Diagnosis not present

## 2016-11-28 DIAGNOSIS — D508 Other iron deficiency anemias: Secondary | ICD-10-CM | POA: Diagnosis not present

## 2016-11-28 DIAGNOSIS — E7800 Pure hypercholesterolemia, unspecified: Secondary | ICD-10-CM | POA: Diagnosis not present

## 2016-11-28 DIAGNOSIS — E538 Deficiency of other specified B group vitamins: Secondary | ICD-10-CM | POA: Diagnosis not present

## 2016-11-28 DIAGNOSIS — I872 Venous insufficiency (chronic) (peripheral): Secondary | ICD-10-CM | POA: Diagnosis not present

## 2016-11-28 DIAGNOSIS — I714 Abdominal aortic aneurysm, without rupture: Secondary | ICD-10-CM | POA: Diagnosis not present

## 2016-11-28 DIAGNOSIS — G308 Other Alzheimer's disease: Secondary | ICD-10-CM | POA: Diagnosis not present

## 2016-11-28 DIAGNOSIS — I10 Essential (primary) hypertension: Secondary | ICD-10-CM | POA: Diagnosis not present

## 2016-11-28 DIAGNOSIS — N3941 Urge incontinence: Secondary | ICD-10-CM | POA: Diagnosis not present

## 2016-11-28 DIAGNOSIS — Z7389 Other problems related to life management difficulty: Secondary | ICD-10-CM | POA: Diagnosis not present

## 2017-02-03 DIAGNOSIS — N3941 Urge incontinence: Secondary | ICD-10-CM | POA: Diagnosis not present

## 2017-02-03 DIAGNOSIS — N3020 Other chronic cystitis without hematuria: Secondary | ICD-10-CM | POA: Diagnosis not present

## 2017-04-07 DIAGNOSIS — N390 Urinary tract infection, site not specified: Secondary | ICD-10-CM | POA: Diagnosis not present

## 2017-04-07 DIAGNOSIS — E46 Unspecified protein-calorie malnutrition: Secondary | ICD-10-CM | POA: Diagnosis not present

## 2017-04-07 DIAGNOSIS — G309 Alzheimer's disease, unspecified: Secondary | ICD-10-CM | POA: Diagnosis not present

## 2017-04-07 DIAGNOSIS — I10 Essential (primary) hypertension: Secondary | ICD-10-CM | POA: Diagnosis not present

## 2017-04-10 DIAGNOSIS — N390 Urinary tract infection, site not specified: Secondary | ICD-10-CM | POA: Diagnosis not present

## 2017-04-10 DIAGNOSIS — G309 Alzheimer's disease, unspecified: Secondary | ICD-10-CM | POA: Diagnosis not present

## 2017-04-10 DIAGNOSIS — I10 Essential (primary) hypertension: Secondary | ICD-10-CM | POA: Diagnosis not present

## 2017-04-10 DIAGNOSIS — E46 Unspecified protein-calorie malnutrition: Secondary | ICD-10-CM | POA: Diagnosis not present

## 2017-04-11 DIAGNOSIS — E46 Unspecified protein-calorie malnutrition: Secondary | ICD-10-CM | POA: Diagnosis not present

## 2017-04-11 DIAGNOSIS — N390 Urinary tract infection, site not specified: Secondary | ICD-10-CM | POA: Diagnosis not present

## 2017-04-11 DIAGNOSIS — I10 Essential (primary) hypertension: Secondary | ICD-10-CM | POA: Diagnosis not present

## 2017-04-11 DIAGNOSIS — G309 Alzheimer's disease, unspecified: Secondary | ICD-10-CM | POA: Diagnosis not present

## 2017-04-13 DIAGNOSIS — E46 Unspecified protein-calorie malnutrition: Secondary | ICD-10-CM | POA: Diagnosis not present

## 2017-04-13 DIAGNOSIS — I10 Essential (primary) hypertension: Secondary | ICD-10-CM | POA: Diagnosis not present

## 2017-04-13 DIAGNOSIS — G309 Alzheimer's disease, unspecified: Secondary | ICD-10-CM | POA: Diagnosis not present

## 2017-04-13 DIAGNOSIS — N390 Urinary tract infection, site not specified: Secondary | ICD-10-CM | POA: Diagnosis not present

## 2017-04-17 DIAGNOSIS — G309 Alzheimer's disease, unspecified: Secondary | ICD-10-CM | POA: Diagnosis not present

## 2017-04-17 DIAGNOSIS — N390 Urinary tract infection, site not specified: Secondary | ICD-10-CM | POA: Diagnosis not present

## 2017-04-17 DIAGNOSIS — I10 Essential (primary) hypertension: Secondary | ICD-10-CM | POA: Diagnosis not present

## 2017-04-17 DIAGNOSIS — E46 Unspecified protein-calorie malnutrition: Secondary | ICD-10-CM | POA: Diagnosis not present

## 2017-04-18 DIAGNOSIS — G309 Alzheimer's disease, unspecified: Secondary | ICD-10-CM | POA: Diagnosis not present

## 2017-04-18 DIAGNOSIS — I10 Essential (primary) hypertension: Secondary | ICD-10-CM | POA: Diagnosis not present

## 2017-04-18 DIAGNOSIS — N390 Urinary tract infection, site not specified: Secondary | ICD-10-CM | POA: Diagnosis not present

## 2017-04-18 DIAGNOSIS — E46 Unspecified protein-calorie malnutrition: Secondary | ICD-10-CM | POA: Diagnosis not present

## 2017-04-19 DIAGNOSIS — N390 Urinary tract infection, site not specified: Secondary | ICD-10-CM | POA: Diagnosis not present

## 2017-04-19 DIAGNOSIS — I10 Essential (primary) hypertension: Secondary | ICD-10-CM | POA: Diagnosis not present

## 2017-04-19 DIAGNOSIS — G309 Alzheimer's disease, unspecified: Secondary | ICD-10-CM | POA: Diagnosis not present

## 2017-04-19 DIAGNOSIS — E46 Unspecified protein-calorie malnutrition: Secondary | ICD-10-CM | POA: Diagnosis not present

## 2017-04-20 DIAGNOSIS — N390 Urinary tract infection, site not specified: Secondary | ICD-10-CM | POA: Diagnosis not present

## 2017-04-20 DIAGNOSIS — I10 Essential (primary) hypertension: Secondary | ICD-10-CM | POA: Diagnosis not present

## 2017-04-20 DIAGNOSIS — G309 Alzheimer's disease, unspecified: Secondary | ICD-10-CM | POA: Diagnosis not present

## 2017-04-20 DIAGNOSIS — E46 Unspecified protein-calorie malnutrition: Secondary | ICD-10-CM | POA: Diagnosis not present

## 2017-04-24 DIAGNOSIS — G309 Alzheimer's disease, unspecified: Secondary | ICD-10-CM | POA: Diagnosis not present

## 2017-04-24 DIAGNOSIS — I10 Essential (primary) hypertension: Secondary | ICD-10-CM | POA: Diagnosis not present

## 2017-04-24 DIAGNOSIS — N390 Urinary tract infection, site not specified: Secondary | ICD-10-CM | POA: Diagnosis not present

## 2017-04-24 DIAGNOSIS — E46 Unspecified protein-calorie malnutrition: Secondary | ICD-10-CM | POA: Diagnosis not present

## 2017-04-25 DIAGNOSIS — G309 Alzheimer's disease, unspecified: Secondary | ICD-10-CM | POA: Diagnosis not present

## 2017-04-25 DIAGNOSIS — I10 Essential (primary) hypertension: Secondary | ICD-10-CM | POA: Diagnosis not present

## 2017-04-25 DIAGNOSIS — E46 Unspecified protein-calorie malnutrition: Secondary | ICD-10-CM | POA: Diagnosis not present

## 2017-04-25 DIAGNOSIS — N390 Urinary tract infection, site not specified: Secondary | ICD-10-CM | POA: Diagnosis not present

## 2017-04-26 DIAGNOSIS — N390 Urinary tract infection, site not specified: Secondary | ICD-10-CM | POA: Diagnosis not present

## 2017-04-26 DIAGNOSIS — I10 Essential (primary) hypertension: Secondary | ICD-10-CM | POA: Diagnosis not present

## 2017-04-26 DIAGNOSIS — E46 Unspecified protein-calorie malnutrition: Secondary | ICD-10-CM | POA: Diagnosis not present

## 2017-04-26 DIAGNOSIS — G309 Alzheimer's disease, unspecified: Secondary | ICD-10-CM | POA: Diagnosis not present

## 2017-04-28 DIAGNOSIS — G309 Alzheimer's disease, unspecified: Secondary | ICD-10-CM | POA: Diagnosis not present

## 2017-04-28 DIAGNOSIS — I10 Essential (primary) hypertension: Secondary | ICD-10-CM | POA: Diagnosis not present

## 2017-04-28 DIAGNOSIS — E46 Unspecified protein-calorie malnutrition: Secondary | ICD-10-CM | POA: Diagnosis not present

## 2017-04-28 DIAGNOSIS — N390 Urinary tract infection, site not specified: Secondary | ICD-10-CM | POA: Diagnosis not present

## 2017-05-01 DIAGNOSIS — N390 Urinary tract infection, site not specified: Secondary | ICD-10-CM | POA: Diagnosis not present

## 2017-05-01 DIAGNOSIS — E46 Unspecified protein-calorie malnutrition: Secondary | ICD-10-CM | POA: Diagnosis not present

## 2017-05-01 DIAGNOSIS — I10 Essential (primary) hypertension: Secondary | ICD-10-CM | POA: Diagnosis not present

## 2017-05-01 DIAGNOSIS — G309 Alzheimer's disease, unspecified: Secondary | ICD-10-CM | POA: Diagnosis not present

## 2017-05-02 DIAGNOSIS — N390 Urinary tract infection, site not specified: Secondary | ICD-10-CM | POA: Diagnosis not present

## 2017-05-02 DIAGNOSIS — G309 Alzheimer's disease, unspecified: Secondary | ICD-10-CM | POA: Diagnosis not present

## 2017-05-02 DIAGNOSIS — E46 Unspecified protein-calorie malnutrition: Secondary | ICD-10-CM | POA: Diagnosis not present

## 2017-05-02 DIAGNOSIS — I10 Essential (primary) hypertension: Secondary | ICD-10-CM | POA: Diagnosis not present

## 2017-05-03 DIAGNOSIS — G309 Alzheimer's disease, unspecified: Secondary | ICD-10-CM | POA: Diagnosis not present

## 2017-05-03 DIAGNOSIS — N390 Urinary tract infection, site not specified: Secondary | ICD-10-CM | POA: Diagnosis not present

## 2017-05-03 DIAGNOSIS — E46 Unspecified protein-calorie malnutrition: Secondary | ICD-10-CM | POA: Diagnosis not present

## 2017-05-03 DIAGNOSIS — I10 Essential (primary) hypertension: Secondary | ICD-10-CM | POA: Diagnosis not present

## 2017-05-05 DIAGNOSIS — G309 Alzheimer's disease, unspecified: Secondary | ICD-10-CM | POA: Diagnosis not present

## 2017-05-05 DIAGNOSIS — I10 Essential (primary) hypertension: Secondary | ICD-10-CM | POA: Diagnosis not present

## 2017-05-05 DIAGNOSIS — E46 Unspecified protein-calorie malnutrition: Secondary | ICD-10-CM | POA: Diagnosis not present

## 2017-05-05 DIAGNOSIS — N390 Urinary tract infection, site not specified: Secondary | ICD-10-CM | POA: Diagnosis not present

## 2017-05-08 DIAGNOSIS — G309 Alzheimer's disease, unspecified: Secondary | ICD-10-CM | POA: Diagnosis not present

## 2017-05-08 DIAGNOSIS — E46 Unspecified protein-calorie malnutrition: Secondary | ICD-10-CM | POA: Diagnosis not present

## 2017-05-08 DIAGNOSIS — I10 Essential (primary) hypertension: Secondary | ICD-10-CM | POA: Diagnosis not present

## 2017-05-08 DIAGNOSIS — N390 Urinary tract infection, site not specified: Secondary | ICD-10-CM | POA: Diagnosis not present

## 2017-05-09 DIAGNOSIS — N390 Urinary tract infection, site not specified: Secondary | ICD-10-CM | POA: Diagnosis not present

## 2017-05-09 DIAGNOSIS — G309 Alzheimer's disease, unspecified: Secondary | ICD-10-CM | POA: Diagnosis not present

## 2017-05-09 DIAGNOSIS — I10 Essential (primary) hypertension: Secondary | ICD-10-CM | POA: Diagnosis not present

## 2017-05-09 DIAGNOSIS — E46 Unspecified protein-calorie malnutrition: Secondary | ICD-10-CM | POA: Diagnosis not present

## 2017-05-12 DIAGNOSIS — E46 Unspecified protein-calorie malnutrition: Secondary | ICD-10-CM | POA: Diagnosis not present

## 2017-05-12 DIAGNOSIS — G309 Alzheimer's disease, unspecified: Secondary | ICD-10-CM | POA: Diagnosis not present

## 2017-05-12 DIAGNOSIS — N390 Urinary tract infection, site not specified: Secondary | ICD-10-CM | POA: Diagnosis not present

## 2017-05-12 DIAGNOSIS — I10 Essential (primary) hypertension: Secondary | ICD-10-CM | POA: Diagnosis not present

## 2017-05-15 DIAGNOSIS — G309 Alzheimer's disease, unspecified: Secondary | ICD-10-CM | POA: Diagnosis not present

## 2017-05-15 DIAGNOSIS — E46 Unspecified protein-calorie malnutrition: Secondary | ICD-10-CM | POA: Diagnosis not present

## 2017-05-15 DIAGNOSIS — N390 Urinary tract infection, site not specified: Secondary | ICD-10-CM | POA: Diagnosis not present

## 2017-05-15 DIAGNOSIS — I10 Essential (primary) hypertension: Secondary | ICD-10-CM | POA: Diagnosis not present

## 2017-05-16 DIAGNOSIS — N390 Urinary tract infection, site not specified: Secondary | ICD-10-CM | POA: Diagnosis not present

## 2017-05-16 DIAGNOSIS — E46 Unspecified protein-calorie malnutrition: Secondary | ICD-10-CM | POA: Diagnosis not present

## 2017-05-16 DIAGNOSIS — I10 Essential (primary) hypertension: Secondary | ICD-10-CM | POA: Diagnosis not present

## 2017-05-16 DIAGNOSIS — G309 Alzheimer's disease, unspecified: Secondary | ICD-10-CM | POA: Diagnosis not present

## 2017-05-18 DIAGNOSIS — I10 Essential (primary) hypertension: Secondary | ICD-10-CM | POA: Diagnosis not present

## 2017-05-18 DIAGNOSIS — E46 Unspecified protein-calorie malnutrition: Secondary | ICD-10-CM | POA: Diagnosis not present

## 2017-05-18 DIAGNOSIS — G309 Alzheimer's disease, unspecified: Secondary | ICD-10-CM | POA: Diagnosis not present

## 2017-05-18 DIAGNOSIS — N390 Urinary tract infection, site not specified: Secondary | ICD-10-CM | POA: Diagnosis not present

## 2017-05-19 DIAGNOSIS — G309 Alzheimer's disease, unspecified: Secondary | ICD-10-CM | POA: Diagnosis not present

## 2017-05-19 DIAGNOSIS — I10 Essential (primary) hypertension: Secondary | ICD-10-CM | POA: Diagnosis not present

## 2017-05-19 DIAGNOSIS — N390 Urinary tract infection, site not specified: Secondary | ICD-10-CM | POA: Diagnosis not present

## 2017-05-19 DIAGNOSIS — E46 Unspecified protein-calorie malnutrition: Secondary | ICD-10-CM | POA: Diagnosis not present

## 2017-05-20 DIAGNOSIS — N390 Urinary tract infection, site not specified: Secondary | ICD-10-CM | POA: Diagnosis not present

## 2017-05-20 DIAGNOSIS — G309 Alzheimer's disease, unspecified: Secondary | ICD-10-CM | POA: Diagnosis not present

## 2017-05-20 DIAGNOSIS — E46 Unspecified protein-calorie malnutrition: Secondary | ICD-10-CM | POA: Diagnosis not present

## 2017-05-20 DIAGNOSIS — I10 Essential (primary) hypertension: Secondary | ICD-10-CM | POA: Diagnosis not present

## 2017-05-23 DIAGNOSIS — N390 Urinary tract infection, site not specified: Secondary | ICD-10-CM | POA: Diagnosis not present

## 2017-05-23 DIAGNOSIS — I10 Essential (primary) hypertension: Secondary | ICD-10-CM | POA: Diagnosis not present

## 2017-05-23 DIAGNOSIS — G309 Alzheimer's disease, unspecified: Secondary | ICD-10-CM | POA: Diagnosis not present

## 2017-05-23 DIAGNOSIS — E46 Unspecified protein-calorie malnutrition: Secondary | ICD-10-CM | POA: Diagnosis not present

## 2017-05-26 DIAGNOSIS — I10 Essential (primary) hypertension: Secondary | ICD-10-CM | POA: Diagnosis not present

## 2017-05-26 DIAGNOSIS — N390 Urinary tract infection, site not specified: Secondary | ICD-10-CM | POA: Diagnosis not present

## 2017-05-26 DIAGNOSIS — E46 Unspecified protein-calorie malnutrition: Secondary | ICD-10-CM | POA: Diagnosis not present

## 2017-05-26 DIAGNOSIS — G309 Alzheimer's disease, unspecified: Secondary | ICD-10-CM | POA: Diagnosis not present

## 2017-05-30 DIAGNOSIS — I10 Essential (primary) hypertension: Secondary | ICD-10-CM | POA: Diagnosis not present

## 2017-05-30 DIAGNOSIS — N390 Urinary tract infection, site not specified: Secondary | ICD-10-CM | POA: Diagnosis not present

## 2017-05-30 DIAGNOSIS — E46 Unspecified protein-calorie malnutrition: Secondary | ICD-10-CM | POA: Diagnosis not present

## 2017-05-30 DIAGNOSIS — G309 Alzheimer's disease, unspecified: Secondary | ICD-10-CM | POA: Diagnosis not present

## 2017-05-31 DIAGNOSIS — I10 Essential (primary) hypertension: Secondary | ICD-10-CM | POA: Diagnosis not present

## 2017-05-31 DIAGNOSIS — E46 Unspecified protein-calorie malnutrition: Secondary | ICD-10-CM | POA: Diagnosis not present

## 2017-05-31 DIAGNOSIS — N390 Urinary tract infection, site not specified: Secondary | ICD-10-CM | POA: Diagnosis not present

## 2017-05-31 DIAGNOSIS — G309 Alzheimer's disease, unspecified: Secondary | ICD-10-CM | POA: Diagnosis not present

## 2017-06-02 DIAGNOSIS — N390 Urinary tract infection, site not specified: Secondary | ICD-10-CM | POA: Diagnosis not present

## 2017-06-02 DIAGNOSIS — E46 Unspecified protein-calorie malnutrition: Secondary | ICD-10-CM | POA: Diagnosis not present

## 2017-06-02 DIAGNOSIS — I10 Essential (primary) hypertension: Secondary | ICD-10-CM | POA: Diagnosis not present

## 2017-06-02 DIAGNOSIS — G309 Alzheimer's disease, unspecified: Secondary | ICD-10-CM | POA: Diagnosis not present

## 2017-06-03 DIAGNOSIS — E46 Unspecified protein-calorie malnutrition: Secondary | ICD-10-CM | POA: Diagnosis not present

## 2017-06-03 DIAGNOSIS — G309 Alzheimer's disease, unspecified: Secondary | ICD-10-CM | POA: Diagnosis not present

## 2017-06-03 DIAGNOSIS — I10 Essential (primary) hypertension: Secondary | ICD-10-CM | POA: Diagnosis not present

## 2017-06-03 DIAGNOSIS — N390 Urinary tract infection, site not specified: Secondary | ICD-10-CM | POA: Diagnosis not present

## 2017-06-04 DIAGNOSIS — N390 Urinary tract infection, site not specified: Secondary | ICD-10-CM | POA: Diagnosis not present

## 2017-06-04 DIAGNOSIS — I10 Essential (primary) hypertension: Secondary | ICD-10-CM | POA: Diagnosis not present

## 2017-06-04 DIAGNOSIS — E46 Unspecified protein-calorie malnutrition: Secondary | ICD-10-CM | POA: Diagnosis not present

## 2017-06-04 DIAGNOSIS — G309 Alzheimer's disease, unspecified: Secondary | ICD-10-CM | POA: Diagnosis not present

## 2017-06-05 DIAGNOSIS — E46 Unspecified protein-calorie malnutrition: Secondary | ICD-10-CM | POA: Diagnosis not present

## 2017-06-05 DIAGNOSIS — N390 Urinary tract infection, site not specified: Secondary | ICD-10-CM | POA: Diagnosis not present

## 2017-06-05 DIAGNOSIS — I10 Essential (primary) hypertension: Secondary | ICD-10-CM | POA: Diagnosis not present

## 2017-06-05 DIAGNOSIS — G309 Alzheimer's disease, unspecified: Secondary | ICD-10-CM | POA: Diagnosis not present

## 2017-06-06 DIAGNOSIS — G309 Alzheimer's disease, unspecified: Secondary | ICD-10-CM | POA: Diagnosis not present

## 2017-06-06 DIAGNOSIS — I10 Essential (primary) hypertension: Secondary | ICD-10-CM | POA: Diagnosis not present

## 2017-06-06 DIAGNOSIS — N390 Urinary tract infection, site not specified: Secondary | ICD-10-CM | POA: Diagnosis not present

## 2017-06-06 DIAGNOSIS — E46 Unspecified protein-calorie malnutrition: Secondary | ICD-10-CM | POA: Diagnosis not present

## 2017-06-20 DEATH — deceased
